# Patient Record
Sex: Female | Born: 1945 | Race: White | Hispanic: No | Marital: Married | Smoking: Never smoker
Health system: Southern US, Community
[De-identification: ages and names within clinical notes are randomized; demographics above are authoritative.]

## PROBLEM LIST (undated history)

## (undated) DIAGNOSIS — F0390 Unspecified dementia without behavioral disturbance: Secondary | ICD-10-CM

## (undated) HISTORY — PX: BREAST BIOPSY: SHX20

## (undated) HISTORY — PX: TUBAL LIGATION: SHX77

## (undated) HISTORY — DX: Unspecified dementia, unspecified severity, without behavioral disturbance, psychotic disturbance, mood disturbance, and anxiety: F03.90

## (undated) HISTORY — PX: BREAST EXCISIONAL BIOPSY: SUR124

## (undated) HISTORY — PX: BACK SURGERY: SHX140

---

## 2015-01-04 LAB — HM MAMMOGRAPHY

## 2015-03-10 ENCOUNTER — Other Ambulatory Visit: Payer: Self-pay | Admitting: Neurological Surgery

## 2015-03-10 DIAGNOSIS — S32000A Wedge compression fracture of unspecified lumbar vertebra, initial encounter for closed fracture: Secondary | ICD-10-CM

## 2015-03-14 ENCOUNTER — Ambulatory Visit (INDEPENDENT_AMBULATORY_CARE_PROVIDER_SITE_OTHER): Payer: Medicare HMO

## 2015-03-14 DIAGNOSIS — K8020 Calculus of gallbladder without cholecystitis without obstruction: Secondary | ICD-10-CM | POA: Diagnosis not present

## 2015-03-14 DIAGNOSIS — M5126 Other intervertebral disc displacement, lumbar region: Secondary | ICD-10-CM | POA: Diagnosis not present

## 2015-03-14 DIAGNOSIS — S32000A Wedge compression fracture of unspecified lumbar vertebra, initial encounter for closed fracture: Secondary | ICD-10-CM

## 2015-03-23 ENCOUNTER — Ambulatory Visit: Payer: Medicare HMO | Admitting: Family Medicine

## 2015-04-01 ENCOUNTER — Ambulatory Visit (INDEPENDENT_AMBULATORY_CARE_PROVIDER_SITE_OTHER): Payer: Medicare HMO | Admitting: Family Medicine

## 2015-04-01 ENCOUNTER — Encounter: Payer: Self-pay | Admitting: Family Medicine

## 2015-04-01 VITALS — BP 131/78 | HR 89 | Ht 66.0 in | Wt 144.0 lb

## 2015-04-01 DIAGNOSIS — S32010S Wedge compression fracture of first lumbar vertebra, sequela: Secondary | ICD-10-CM

## 2015-04-01 DIAGNOSIS — E039 Hypothyroidism, unspecified: Secondary | ICD-10-CM

## 2015-04-01 DIAGNOSIS — S32010A Wedge compression fracture of first lumbar vertebra, initial encounter for closed fracture: Secondary | ICD-10-CM | POA: Insufficient documentation

## 2015-04-01 DIAGNOSIS — E785 Hyperlipidemia, unspecified: Secondary | ICD-10-CM | POA: Diagnosis not present

## 2015-04-01 DIAGNOSIS — D649 Anemia, unspecified: Secondary | ICD-10-CM | POA: Insufficient documentation

## 2015-04-01 DIAGNOSIS — F32A Depression, unspecified: Secondary | ICD-10-CM

## 2015-04-01 DIAGNOSIS — F329 Major depressive disorder, single episode, unspecified: Secondary | ICD-10-CM

## 2015-04-01 LAB — TSH: TSH: 1.186 u[IU]/mL (ref 0.350–4.500)

## 2015-04-01 MED ORDER — CITALOPRAM HYDROBROMIDE 10 MG PO TABS: ORAL_TABLET | ORAL | Status: DC

## 2015-04-01 NOTE — Progress Notes (Signed)
CC: Suzanne Patrick is a 69 y.o. female is here for Establish Care   Subjective: HPI:   pleasant 69 year old here to establish care   history of hypothyroidism currently taking 75 g of levothyroxine on a daily basis. It's uncertain how long she's had this diagnosis but last TSH was in December 2015. She is uncertain if and when her dose has been changed in the past. There's been  No unintentional weight gain or loss. She denies any skin complaints or hair complaints.   History of hyperlipidemia currently taking atorvastatin. She tells me that she has a strong family history of heart attacks and stroke in her family. Cholesterol was last checked about a year ago and LDL cholesterol was at 88. She denies any personal cardiovascular disease. She wants to stop taking atorvastatin.   Reports a history of depression that began 2 and half years ago after the death of her son. She was  Placed on Celexa for crying spells and depression. She believes it worked very well but she wonders if she still needs to be on it now. She denies any  Current mental disturbance such as anxiety or depression or grieving .   a little under a year ago she slipped on her back porch landed on her buttocks and sustained a compression fracture  At L1. Her pain was bad enough that she required hydrocodone  Almost every day of the week up until a few months ago when the pain had slowly fully resolved.  Report some midline back discomfort if she does strenuous activity however it's infrequent and she has difficulty describing it further than above. Denies any radiation of her pain or motor or sensory disturbances  Review of Systems - General ROS: negative for - chills, fever, night sweats, weight gain or weight loss Ophthalmic ROS: negative for - decreased vision Psychological ROS: negative for - anxiety or depression ENT ROS: negative for - hearing change, nasal congestion, tinnitus or allergies Hematological and Lymphatic ROS:  negative for - bleeding problems, bruising or swollen lymph nodes Breast ROS: negative Respiratory ROS: no cough, shortness of breath, or wheezing Cardiovascular ROS: no chest pain or dyspnea on exertion Gastrointestinal ROS: no abdominal pain, change in bowel habits, or black or bloody stools Genito-Urinary ROS: negative for - genital discharge, genital ulcers, incontinence or abnormal bleeding from genitals Musculoskeletal ROS: negative for - joint pain or muscle painother than that described above Neurological ROS: negative for - headaches or memory loss Dermatological ROS: negative for lumps, mole changes, rash and skin lesion changes  History reviewed. No pertinent past medical history.  Past Surgical History  Procedure Laterality Date  . Tubal ligation    . Back surgery     Family History  Problem Relation Age of Onset  . Heart attack Father   . Hyperlipidemia Mother     History   Social History  . Marital Status: Unknown    Spouse Name: N/A  . Number of Children: N/A  . Years of Education: N/A   Occupational History  . Not on file.   Social History Main Topics  . Smoking status: Never Smoker   . Smokeless tobacco: Not on file  . Alcohol Use: 1.8 oz/week    3 Standard drinks or equivalent per week  . Drug Use: No  . Sexual Activity:    Partners: Male   Other Topics Concern  . Not on file   Social History Narrative  . No narrative on file  Objective: BP 131/78 mmHg  Pulse 89  Ht 5\' 6"  (1.676 m)  Wt 144 lb (65.318 kg)  BMI 23.25 kg/m2  General: Alert and Oriented, No Acute Distress HEENT: Pupils equal, round, reactive to light. Conjunctivae clear. Moist mucous membranes Lungs: Clear to auscultation bilaterally, no wheezing/ronchi/rales.  Comfortable work of breathing. Good air movement. Cardiac: Regular rate and rhythm. Normal S1/S2.  No murmurs, rubs, nor gallops.   Extremities: No peripheral edema.  Strong peripheral pulses.  Mental Status: No  depression, anxiety, nor agitation. Skin: Warm and dry.  Assessment & Plan: Jozalynn was seen today for establish care.  Diagnoses and all orders for this visit:  Hypothyroidism, unspecified hypothyroidism type Orders: -     TSH  Hyperlipidemia  Depression Orders: -     citalopram (CELEXA) 10 MG tablet; One by mouth daily for one week then half a tablet by mouth daily for one week then stop.  Compression fracture of L1 lumbar vertebra, sequela   Hypothyroidism: Clinically controlled due for TSH continue 75 g levothyroxine pending results Hyperlipidemia: Urged her to continue on atorvastatin given that it appears to be controlling her cholesterol well based on most recent lipid panel Depression: Discussed an attempt at stopping citalopram with a taper beginning at 10 mg daily for a week and then 5 mg daily for one week then stop.I have invited her to call me if she feels like she would like to go back on citalopram at either 10 mg or 20 mg daily. Compression fractures currently asymptomatic no further intervention on this issue.   Return in about 3 months (around 07/02/2015).

## 2015-05-02 ENCOUNTER — Telehealth: Payer: Self-pay

## 2015-05-02 NOTE — Telephone Encounter (Signed)
Pt called stating that she feel like she is having some arthritis pain in her leg and knees and want to know if there is any OTC meds you recommend for this. The pt also called in regards to her daughter Quentin Mulling DOB 10/15/1983. Theadora Rama is overweight and recently lost her insurance. She is interested in scheduling a office visit to discuss weight loss meds but want to know how much it would be for the visit and the rx. Please advise.

## 2015-05-02 NOTE — Telephone Encounter (Signed)
My favorite OTC anti-inflammatory is aleve 1-2 tabs twice a day as needed for pain.  As for Suzanne Patrick I'd recommend she do some online research on phentermine since this is the first line medication for weight loss in our clinic.  If she wants to try it, all we would need is a nurse visit to get a baseline weight and I wouldn't charge her for that visit. This medication is usually about $30.  If she wants to discus other options then an office visit would be required, I'd estimate an office visit would cost about $150 before a 50% discount [facility] gives self pay patients.

## 2015-05-03 NOTE — Telephone Encounter (Signed)
Pt notified of recommendations and verbalized understanding  

## 2015-06-08 ENCOUNTER — Ambulatory Visit (INDEPENDENT_AMBULATORY_CARE_PROVIDER_SITE_OTHER): Payer: Medicare HMO | Admitting: Family Medicine

## 2015-06-08 ENCOUNTER — Encounter: Payer: Self-pay | Admitting: Family Medicine

## 2015-06-08 VITALS — BP 124/80 | HR 86 | Wt 143.0 lb

## 2015-06-08 DIAGNOSIS — L821 Other seborrheic keratosis: Secondary | ICD-10-CM

## 2015-06-08 DIAGNOSIS — A499 Bacterial infection, unspecified: Secondary | ICD-10-CM | POA: Diagnosis not present

## 2015-06-08 DIAGNOSIS — J329 Chronic sinusitis, unspecified: Secondary | ICD-10-CM | POA: Diagnosis not present

## 2015-06-08 DIAGNOSIS — B9689 Other specified bacterial agents as the cause of diseases classified elsewhere: Secondary | ICD-10-CM

## 2015-06-08 MED ORDER — CEFDINIR 300 MG PO CAPS: 300.0000 mg | ORAL_CAPSULE | Freq: Two times a day (BID) | ORAL | Status: AC

## 2015-06-08 NOTE — Progress Notes (Signed)
CC: Suzanne Patrick is a 69 y.o. female is here for Sinusitis   Subjective: HPI:  Complains of facial pressure localizing the forehead with nonproductive cough and scratchy throat. Symptoms have been present for 2 weeks now. Seems to be worse in the evenings. Interventions have included ibuprofen without much benefit. No benefit from over-the-counter anti-histamines. Symptoms are moderate in severity. Has had subjective fever and chills. Denies difficulty swallowing, wheezing, shortness of breath, productive cough nor confusion. No vision loss.  She points out a growth on her right shin that's been present for matter of years. It is not painful but occasionally will itch. Does not seem to be getting larger or smaller. She wants to know what is   Review Of Systems Outlined In HPI  No past medical history on file.  Past Surgical History  Procedure Laterality Date  . Tubal ligation    . Back surgery     Family History  Problem Relation Age of Onset  . Heart attack Father   . Hyperlipidemia Mother     History   Social History  . Marital Status: Unknown    Spouse Name: N/A  . Number of Children: N/A  . Years of Education: N/A   Occupational History  . Not on file.   Social History Main Topics  . Smoking status: Never Smoker   . Smokeless tobacco: Not on file  . Alcohol Use: 1.8 oz/week    3 Standard drinks or equivalent per week  . Drug Use: No  . Sexual Activity:    Partners: Male   Other Topics Concern  . Not on file   Social History Narrative     Objective: BP 124/80 mmHg  Pulse 86  Wt 143 lb (64.864 kg)  General: Alert and Oriented, No Acute Distress HEENT: Pupils equal, round, reactive to light. Conjunctivae clear.  External ears unremarkable, canals clear with intact TMs with appropriate landmarks.  Middle ear appears open without effusion. Pink inferior turbinates.  Moist mucous membranes, pharynx without inflammation nor lesions other than postnasal drip  and cobblestoning.  Neck supple without palpable lymphadenopathy nor abnormal masses. Lungs: Clear to auscultation bilaterally, no wheezing/ronchi/rales.  Comfortable work of breathing. Good air movement. Cardiac: Regular rate and rhythm. Normal S1/S2.  No murmurs, rubs, nor gallops.   Mental Status: No depression, anxiety, nor agitation. Skin: Warm and dry. 4 mm diameter slightly raised noninflamed seborrheic keratosis on the right shin  Assessment & Plan: Suzanne Patrick was seen today for sinusitis.  Diagnoses and all orders for this visit:  Bacterial sinusitis Orders: -     cefdinir (OMNICEF) 300 MG capsule; Take 1 capsule (300 mg total) by mouth 2 (two) times daily.  Seborrheic keratoses   bacterial sinusitis: Consider nasal saline washes and start [place]. Reassurance provided that her seborrheic keratosis is benign and unless it's causing her pain there is no real strong indication to have it removed. She was comforted and does not want anything to be done about it  25 minutes spent face-to-face during visit today of which at least 50% was counseling or coordinating care regarding: 1. Bacterial sinusitis   2. Seborrheic keratoses     Return if symptoms worsen or fail to improve.

## 2015-06-20 ENCOUNTER — Encounter: Payer: Self-pay | Admitting: Cardiology

## 2015-07-11 ENCOUNTER — Telehealth: Payer: Self-pay | Admitting: *Deleted

## 2015-07-11 DIAGNOSIS — N644 Mastodynia: Secondary | ICD-10-CM

## 2015-07-11 NOTE — Telephone Encounter (Signed)
Pt called and states she is having a lot of itching in her breast and thinks its due to the "chip" that she has in her breast. Pt would like a referral place to have this removed.

## 2015-07-12 MED ORDER — TRIAMCINOLONE ACETONIDE 0.1 % EX CREA: TOPICAL_CREAM | CUTANEOUS | Status: DC

## 2015-07-12 NOTE — Telephone Encounter (Signed)
Pt.notified

## 2015-07-12 NOTE — Telephone Encounter (Signed)
Suzanne Patrick, Referral placed.  I can't confidently conclude that her itching is coming from the chip therefore I'd recommend starting a topical trimacinalone regimen for the next two weeks to see if this resolves the itching.  Rx sent to [facility].

## 2015-07-14 ENCOUNTER — Telehealth: Payer: Self-pay | Admitting: *Deleted

## 2015-07-14 DIAGNOSIS — N644 Mastodynia: Secondary | ICD-10-CM

## 2015-07-14 NOTE — Telephone Encounter (Signed)
Pt wants referral to go to [facility] with Dr. Dia Sitter

## 2015-08-01 ENCOUNTER — Encounter: Payer: Self-pay | Admitting: Family Medicine

## 2015-08-01 DIAGNOSIS — D249 Benign neoplasm of unspecified breast: Secondary | ICD-10-CM | POA: Insufficient documentation

## 2015-09-14 ENCOUNTER — Encounter: Payer: Self-pay | Admitting: Family Medicine

## 2015-09-14 ENCOUNTER — Ambulatory Visit (INDEPENDENT_AMBULATORY_CARE_PROVIDER_SITE_OTHER): Payer: Medicare HMO | Admitting: Family Medicine

## 2015-09-14 VITALS — BP 118/75 | HR 95 | Wt 141.0 lb

## 2015-09-14 DIAGNOSIS — R1310 Dysphagia, unspecified: Secondary | ICD-10-CM | POA: Diagnosis not present

## 2015-09-14 DIAGNOSIS — R413 Other amnesia: Secondary | ICD-10-CM | POA: Diagnosis not present

## 2015-09-14 MED ORDER — PREDNISONE 20 MG PO TABS: ORAL_TABLET | ORAL | Status: AC

## 2015-09-14 NOTE — Progress Notes (Signed)
CC: Suzanne Patrick is a 69 y.o. female is here for Nasal Congestion   Subjective: HPI:  Complains of a knot sensation in the back of her right throat. She tells me it feels like something is stuck back there. This is been going on for 2 weeks. Is mild in severity but grabbing her attention. No interventions as of yet. She also believes it's making her voice little hoarse. She denies any choking or pulmonary complaints. She denies fevers, chills, aspiration, neck pain, regurgitation, shortness of breath but does have a new cough that began 2 weeks ago as well.  She tells me that she went to the wrong clinic this afternoon forgetting that my office was here at this clinic. She tells me that she's noticed that she is more forgetful in regards to short-term memory over the past few months. Her husband has noticed that as well. She wants to know if there is some sort of tested check on her memory. She denies any long-term memory loss.   Review Of Systems Outlined In HPI  No past medical history on file.  Past Surgical History  Procedure Laterality Date  . Tubal ligation    . Back surgery     Family History  Problem Relation Age of Onset  . Heart attack Father   . Hyperlipidemia Mother     Social History   Social History  . Marital Status: Unknown    Spouse Name: N/A  . Number of Children: N/A  . Years of Education: N/A   Occupational History  . Not on file.   Social History Main Topics  . Smoking status: Never Smoker   . Smokeless tobacco: Not on file  . Alcohol Use: 1.8 oz/week    3 Standard drinks or equivalent per week  . Drug Use: No  . Sexual Activity:    Partners: Male   Other Topics Concern  . Not on file   Social History Narrative     Objective: BP 118/75 mmHg  Pulse 95  Wt 141 lb (63.957 kg)  General: Alert and Oriented, No Acute Distress HEENT: Pupils equal, round, reactive to light. Conjunctivae clear.  External ears unremarkable, canals clear with  intact TMs with appropriate landmarks.  Middle ear appears open without effusion. Pink inferior turbinates.  Moist mucous membranes, pharynx without inflammation nor lesions.  Neck supple without palpable lymphadenopathy nor abnormal masses. Lungs: Clear comfortable work of breathing Extremities: No peripheral edema.  Strong peripheral pulses.  Mental Status: No depression, anxiety, nor agitation. Skin: Warm and dry.  Assessment & Plan: Suzanne Patrick was seen today for nasal congestion.  Diagnoses and all orders for this visit:  Dysphagia -     predniSONE (DELTASONE) 20 MG tablet; Two tabs at once daily for five days.  Short-term memory loss   Dysphagia: Start moderate dose of prednisone for suspicion of laryngitis.Signs and symptoms requring emergent/urgent reevaluation were discussed with the patient.  short-term memory loss:  Discussed that this definitely warrants further investigation and that she should follow-up at her convenience for visit dedicated to this so we can do a mental status exam and also some blood work.  25 minutes spent face-to-face during visit today of which at least 50% was counseling or coordinating care regarding: 1. Dysphagia   2. Short-term memory loss      Return if symptoms worsen or fail to improve.

## 2015-09-19 ENCOUNTER — Telehealth: Payer: Self-pay

## 2015-09-19 MED ORDER — MELOXICAM 15 MG PO TABS
15.0000 mg | ORAL_TABLET | Freq: Every day | ORAL | Status: DC | PRN
Start: 2015-09-19 — End: 2016-12-18

## 2015-09-19 NOTE — Telephone Encounter (Signed)
An additional anti-inflammatory has been sent to her [facility]

## 2015-09-19 NOTE — Telephone Encounter (Signed)
Pt advised.

## 2015-09-19 NOTE — Telephone Encounter (Signed)
Pt reports that her throat is a little better but is still sore and wants to know can something be sent to the pharmacy?

## 2015-10-03 ENCOUNTER — Telehealth: Payer: Self-pay

## 2015-10-03 NOTE — Telephone Encounter (Signed)
Pt called stating that she believes prednisone gave her bad breath.  She has tried everything from different mouth washes to flossing.  She wants to know do you have any suggestions?

## 2015-10-03 NOTE — Telephone Encounter (Signed)
OTC probiotic daily other than that I'm not sure of any other ideas.

## 2015-10-03 NOTE — Telephone Encounter (Signed)
Pt.notified

## 2015-10-12 ENCOUNTER — Encounter: Payer: Self-pay | Admitting: Family Medicine

## 2015-10-12 ENCOUNTER — Ambulatory Visit (INDEPENDENT_AMBULATORY_CARE_PROVIDER_SITE_OTHER): Payer: Medicare HMO | Admitting: Family Medicine

## 2015-10-12 VITALS — BP 127/88 | HR 78 | Wt 137.0 lb

## 2015-10-12 DIAGNOSIS — R1310 Dysphagia, unspecified: Secondary | ICD-10-CM | POA: Diagnosis not present

## 2015-10-12 DIAGNOSIS — E039 Hypothyroidism, unspecified: Secondary | ICD-10-CM

## 2015-10-12 DIAGNOSIS — Z23 Encounter for immunization: Secondary | ICD-10-CM | POA: Diagnosis not present

## 2015-10-12 DIAGNOSIS — S39011A Strain of muscle, fascia and tendon of abdomen, initial encounter: Secondary | ICD-10-CM | POA: Diagnosis not present

## 2015-10-12 LAB — TSH: TSH: 0.412 u[IU]/mL (ref 0.350–4.500)

## 2015-10-12 NOTE — Progress Notes (Signed)
CC: Suzanne Patrick is a 69 y.o. female is here for lump in throat and Hip Pain   Subjective: HPI:   complains of a knot sensation in her throat present all hours of the day. Symptoms have not improved with use of prednisone nor nonsteroidal anti-inflammatories.  Additionally no benefit from salt water gargles or mouthwash. No other interventions as of yet. Symptoms are mild in severity and have not been getting better or worse since onset about a month ago.  Denies any difficulty swallowing such as choking or regurgitation. She denies any reflux symptoms.  There has been no change in her voice or unintentional weight loss.   She's been having some difficulty with feeling fatigued most hours of the day. She denies nonrestorative sleep or witnessed apneic episodes. Denies shortness of breath or weakness.   She's also having some right and left lower quadrant tenderness That's absent first in the morning and seems to worsen as the day goes on. It's worse with decorating her house. Nothing else makes it better or worse. She is  Uncertain how long it's been going on.  She denies any  Fevers, chills, nor nausea.  Denies any constipation or diarrhea.   Review Of Systems Outlined In HPI  No past medical history on file.  Past Surgical History  Procedure Laterality Date  . Tubal ligation    . Back surgery     Family History  Problem Relation Age of Onset  . Heart attack Father   . Hyperlipidemia Mother     Social History   Social History  . Marital Status: Unknown    Spouse Name: N/A  . Number of Children: N/A  . Years of Education: N/A   Occupational History  . Not on file.   Social History Main Topics  . Smoking status: Never Smoker   . Smokeless tobacco: Not on file  . Alcohol Use: 1.8 oz/week    3 Standard drinks or equivalent per week  . Drug Use: No  . Sexual Activity:    Partners: Male   Other Topics Concern  . Not on file   Social History Narrative      Objective: BP 127/88 mmHg  Pulse 78  Wt 137 lb (62.143 kg)  General: Alert and Oriented, No Acute Distress HEENT: Pupils equal, round, reactive to light. Conjunctivae clear.   Moist mucous membranes, pharynx without inflammation nor lesions.  Neck supple without palpable lymphadenopathy nor abnormal masses. Lungs: Clear to auscultation bilaterally, no wheezing/ronchi/rales.  Comfortable work of breathing. Good air movement. Cardiac: Regular rate and rhythm. Normal S1/S2.  No murmurs, rubs, nor gallops.   Abdomen: Normal bowel sounds, soft and non tender without palpable masses.  No rebound tenderness Extremities: No peripheral edema.  Strong peripheral pulses.  Mental Status: No depression, anxiety, nor agitation. Skin: Warm and dry.  Assessment & Plan: Suzanne Patrick was seen today for lump in throat and hip pain.  Diagnoses and all orders for this visit:  Need for prophylactic vaccination against Streptococcus pneumoniae (pneumococcus) -     Pneumococcal conjugate vaccine 13-valent  Encounter for immunization  Hypothyroidism, unspecified hypothyroidism type -     TSH  Dysphagia -     US Soft Tissue Head/Neck; Future  Abdominal wall strain, initial encounter  Other orders -     Flu Vaccine QUAD 36+ mos IM    ultrasound of the neck to rule out thyroid nodule or soft tissue mass causing her sensation of a knot in the throat.  rule out uncontrolled hypothyroidism with TSH  To see if her fatigue is due to underdosed  Levothyroxine  Start abdominal wall rehabilitation exercises that were given to her today, I've encouraged to do this on a daily basis for the next 2-3 weeks to help with abdominal pain.  Signs and symptoms requring emergent/urgent reevaluation were discussed with the patient.  Return if symptoms worsen or fail to improve.

## 2015-10-13 ENCOUNTER — Telehealth: Payer: Self-pay | Admitting: Family Medicine

## 2015-10-13 ENCOUNTER — Ambulatory Visit (INDEPENDENT_AMBULATORY_CARE_PROVIDER_SITE_OTHER): Payer: Medicare HMO

## 2015-10-13 DIAGNOSIS — R1310 Dysphagia, unspecified: Secondary | ICD-10-CM

## 2015-10-13 NOTE — Telephone Encounter (Signed)
Will you please let patient know that her neck ultrasound and thyroid blood test were normal.  As discussed during our office visit I'll place a referral to an ENT specialist for further work up.

## 2015-10-13 NOTE — Telephone Encounter (Signed)
Pt.notified

## 2015-10-13 NOTE — Telephone Encounter (Signed)
Phone not accepting vm 

## 2015-11-02 ENCOUNTER — Encounter: Payer: Self-pay | Admitting: Family Medicine

## 2015-11-02 DIAGNOSIS — J3501 Chronic tonsillitis: Secondary | ICD-10-CM | POA: Insufficient documentation

## 2015-11-28 ENCOUNTER — Telehealth: Payer: Self-pay

## 2015-11-28 NOTE — Telephone Encounter (Signed)
Pt.notified

## 2015-11-28 NOTE — Telephone Encounter (Signed)
I can do the one she's referring to, I will also want to do some blood work when she finds time to schedule this appointment.

## 2015-11-28 NOTE — Telephone Encounter (Signed)
Pt is asking for an appointment for a memory test.  Are those done in this office? (Call pt at this number [phone] *FYI for Vernessa Likes*)

## 2015-12-06 ENCOUNTER — Ambulatory Visit (INDEPENDENT_AMBULATORY_CARE_PROVIDER_SITE_OTHER): Payer: Medicare HMO | Admitting: Family Medicine

## 2015-12-06 ENCOUNTER — Encounter: Payer: Self-pay | Admitting: Family Medicine

## 2015-12-06 VITALS — BP 129/70 | HR 81 | Wt 137.0 lb

## 2015-12-06 DIAGNOSIS — G309 Alzheimer's disease, unspecified: Secondary | ICD-10-CM | POA: Insufficient documentation

## 2015-12-06 DIAGNOSIS — F0280 Dementia in other diseases classified elsewhere without behavioral disturbance: Secondary | ICD-10-CM | POA: Insufficient documentation

## 2015-12-06 DIAGNOSIS — F0390 Unspecified dementia without behavioral disturbance: Secondary | ICD-10-CM | POA: Insufficient documentation

## 2015-12-06 DIAGNOSIS — R413 Other amnesia: Secondary | ICD-10-CM

## 2015-12-06 DIAGNOSIS — K130 Diseases of lips: Secondary | ICD-10-CM

## 2015-12-06 LAB — COMPLETE METABOLIC PANEL WITH GFR
ALT: 17 U/L (ref 6–29)
AST: 21 U/L (ref 10–35)
Albumin: 4.3 g/dL (ref 3.6–5.1)
Alkaline Phosphatase: 91 U/L (ref 33–130)
BILIRUBIN TOTAL: 0.6 mg/dL (ref 0.2–1.2)
BUN: 22 mg/dL (ref 7–25)
CHLORIDE: 105 mmol/L (ref 98–110)
CO2: 25 mmol/L (ref 20–31)
CREATININE: 0.9 mg/dL (ref 0.60–0.93)
Calcium: 9.2 mg/dL (ref 8.6–10.4)
GFR, Est African American: 75 mL/min (ref >=60)
GFR, Est Non African American: 65 mL/min (ref >=60)
Glucose, Bld: 97 mg/dL (ref 65–99)
Potassium: 3.9 mmol/L (ref 3.5–5.3)
Sodium: 139 mmol/L (ref 135–146)
TOTAL PROTEIN: 6.4 g/dL (ref 6.1–8.1)

## 2015-12-06 LAB — VITAMIN B12: VITAMIN B 12: 409 pg/mL (ref 211–911)

## 2015-12-06 MED ORDER — TRIAMCINOLONE ACETONIDE 0.5 % EX OINT: 1.0000 "application " | TOPICAL_OINTMENT | Freq: Two times a day (BID) | CUTANEOUS | Status: DC

## 2015-12-06 NOTE — Progress Notes (Signed)
CC: Suzanne Patrick is a 70 y.o. female is here for mini mental   Subjective: HPI:  Complains of cracking and irritation in the left corner of the lip. It's been present for the last 2 or 3 days. No intervention as of yet. Slightly tender. It's mild in severity. Nothing seems to make it better or worse.  Her main concern is memory loss that's been noticed by her family and herself over the past few months. It's present on a daily basis and mild in severity. The family is concerned that she is suffering from dementia. She denies any long-term memory loss but focuses on her issues with short-term memory loss. For example she'll forget tasks that she has planned out for later in the day. She'll start a task only to forget halfway through why she is doing however within a few minutes sure member why she was doing the previous task. She denies any issues with getting lost. No new motor or sensory disturbances.   Review Of Systems Outlined In HPI  No past medical history on file.  Past Surgical History  Procedure Laterality Date  . Tubal ligation    . Back surgery     Family History  Problem Relation Age of Onset  . Heart attack Father   . Hyperlipidemia Mother     Social History   Social History  . Marital Status: Unknown    Spouse Name: N/A  . Number of Children: N/A  . Years of Education: N/A   Occupational History  . Not on file.   Social History Main Topics  . Smoking status: Never Smoker   . Smokeless tobacco: Not on file  . Alcohol Use: 1.8 oz/week    3 Standard drinks or equivalent per week  . Drug Use: No  . Sexual Activity:    Partners: Male   Other Topics Concern  . Not on file   Social History Narrative     Objective: BP 129/70 mmHg  Pulse 81  Wt 137 lb (62.143 kg)  Vital signs reviewed. General: Alert and Oriented, No Acute Distress HEENT: Pupils equal, round, reactive to light. Conjunctivae clear.  External ears unremarkable.  Moist mucous membranes.   Lungs: Clear and comfortable work of breathing, speaking in full sentences without accessory muscle use. Cardiac: Regular rate and rhythm.  Neuro: CN II-XII grossly intact, gait normal. Extremities: No peripheral edema.  Strong peripheral pulses.  Mental Status: No depression, anxiety, nor agitation. Logical though process. Skin: Warm and dry. Mild erythema and cracking in the left corner of the lip. Assessment & Plan: Fiza was seen today for mini mental.  Diagnoses and all orders for this visit:  Memory loss -     Vitamin B12 -     COMPLETE METABOLIC PANEL WITH GFR  Angular cheilitis  Other orders -     triamcinolone ointment (KENALOG) 0.5 %; Apply 1 application topically 2 (two) times daily. Apply only for two weeks.   memory loss: She did great on the MMSE, she has a high school level of education and got 29 out of 30. She had difficulty spelling world backwards. Will rule out metabolic abnormality or [id number] deficiency. If normal she would still like to try Aricept to help preserve mild memory loss. Discussed that her MMSE was reassuring and makes dementia very unlikely at this time.  25 minutes spent face-to-face during visit today of which at least 50% was counseling or coordinating care regarding: 1. Memory loss   2. Angular  cheilitis       No Follow-up on file.

## 2015-12-07 ENCOUNTER — Telehealth: Payer: Self-pay | Admitting: Family Medicine

## 2015-12-07 MED ORDER — LIDOCAINE VISCOUS 2 % MT SOLN: OROMUCOSAL | Status: DC

## 2015-12-07 MED ORDER — DONEPEZIL HCL 5 MG PO TABS: 5.0000 mg | ORAL_TABLET | Freq: Every day | ORAL | Status: DC

## 2015-12-07 NOTE — Telephone Encounter (Signed)
Left message to call for recommendations

## 2015-12-07 NOTE — Telephone Encounter (Signed)
Will you please let patient know that her blood work was normal therefore I'll send in a Rx of Aricept to her [facility], this is to help with memory.

## 2015-12-07 NOTE — Telephone Encounter (Signed)
I'll send in a Rx for viscous lidocaine.  Please remind patient that Dr. Amedeo Kinsman note from her encounter in December recommended that she follow up with him if symptoms worsen.

## 2015-12-07 NOTE — Telephone Encounter (Signed)
Suzanne Patrick is asking could you possibly Rx her something for her throat?

## 2015-12-08 ENCOUNTER — Other Ambulatory Visit: Payer: Self-pay

## 2015-12-08 MED ORDER — DONEPEZIL HCL 5 MG PO TABS: 5.0000 mg | ORAL_TABLET | Freq: Every day | ORAL | Status: DC

## 2015-12-08 MED ORDER — LIDOCAINE VISCOUS 2 % MT SOLN: OROMUCOSAL | Status: DC

## 2015-12-08 NOTE — Telephone Encounter (Signed)
Pt advised to follow up with Dr. Vevelyn Royals

## 2015-12-08 NOTE — Telephone Encounter (Signed)
Pt.notified

## 2015-12-28 ENCOUNTER — Telehealth: Payer: Self-pay

## 2015-12-28 NOTE — Telephone Encounter (Signed)
It would be wise to re-vist with Dr. Vevelyn Royals at [facility].  If she needs help scheduling an appointment can you please see if the referral clerk of the day can arrange this?

## 2015-12-28 NOTE — Telephone Encounter (Signed)
Pt stated that during her last visit you suggested that she contact us if her throat isn't any better.

## 2015-12-29 NOTE — Telephone Encounter (Signed)
Pt advised.

## 2016-02-01 ENCOUNTER — Other Ambulatory Visit: Payer: Self-pay

## 2016-02-01 MED ORDER — DONEPEZIL HCL 5 MG PO TABS: 5.0000 mg | ORAL_TABLET | Freq: Every day | ORAL | Status: DC

## 2016-02-20 ENCOUNTER — Telehealth: Payer: Self-pay

## 2016-02-20 DIAGNOSIS — R1310 Dysphagia, unspecified: Secondary | ICD-10-CM

## 2016-02-20 NOTE — Telephone Encounter (Signed)
Pt is asking to be referred to a different ENT office for a second opinion.

## 2016-02-20 NOTE — Telephone Encounter (Signed)
Referral placed.

## 2016-02-28 ENCOUNTER — Ambulatory Visit (INDEPENDENT_AMBULATORY_CARE_PROVIDER_SITE_OTHER): Payer: Medicare HMO | Admitting: Family Medicine

## 2016-02-28 ENCOUNTER — Encounter: Payer: Self-pay | Admitting: Family Medicine

## 2016-02-28 ENCOUNTER — Other Ambulatory Visit (HOSPITAL_COMMUNITY)
Admission: RE | Admit: 2016-02-28 | Discharge: 2016-02-28 | Disposition: A | Discharge status: Home / Self Care | Payer: Medicare HMO | Source: Ambulatory Visit | Attending: Family Medicine | Admitting: Family Medicine

## 2016-02-28 VITALS — BP 132/91 | HR 92 | Wt 131.0 lb

## 2016-02-28 DIAGNOSIS — Z1151 Encounter for screening for human papillomavirus (HPV): Secondary | ICD-10-CM | POA: Diagnosis present

## 2016-02-28 DIAGNOSIS — Z01419 Encounter for gynecological examination (general) (routine) without abnormal findings: Secondary | ICD-10-CM | POA: Diagnosis present

## 2016-02-28 DIAGNOSIS — L739 Follicular disorder, unspecified: Secondary | ICD-10-CM | POA: Diagnosis not present

## 2016-02-28 DIAGNOSIS — Z124 Encounter for screening for malignant neoplasm of cervix: Secondary | ICD-10-CM

## 2016-02-28 MED ORDER — MUPIROCIN 2 % EX OINT: TOPICAL_OINTMENT | CUTANEOUS | Status: DC

## 2016-02-28 NOTE — Progress Notes (Signed)
CC: Suzanne Patrick is a 70 y.o. female is here for Gynecologic Exam   Subjective: HPI:   requesting a routine Pap smear, she is not certain when her last menstrual cycle was but was more than a few years ago since she went into menopause. She denies any genitourinary complaints other than some bumps on her left labia that are slightly tender to the touch. She is not sure how long they've been there but they seem to fluctuate over the past couple months. No interventions as of yet. No fevers, chills, nor skin changes elsewhere.   Review Of Systems Outlined In HPI  No past medical history on file.  Past Surgical History  Procedure Laterality Date  . Tubal ligation    . Back surgery     Family History  Problem Relation Age of Onset  . Heart attack Father   . Hyperlipidemia Mother     Social History   Social History  . Marital Status: Unknown    Spouse Name: N/A  . Number of Children: N/A  . Years of Education: N/A   Occupational History  . Not on file.   Social History Main Topics  . Smoking status: Never Smoker   . Smokeless tobacco: Not on file  . Alcohol Use: 1.8 oz/week    3 Standard drinks or equivalent per week  . Drug Use: No  . Sexual Activity:    Partners: Male   Other Topics Concern  . Not on file   Social History Narrative     Objective: BP 132/91 mmHg  Pulse 92  Wt 131 lb (59.421 kg)   GU: Labia majora & minora without lesions other than mild folliculitis on the left labia majora..  Distal urethra unremarkable.  Vaginal wall integrity preserved without mucosal lesions.  Cervix non-tender and without gross lesions nor discharge.   Vital signs reviewed. General: Alert and Oriented, No Acute Distress HEENT: Pupils equal, round, reactive to light. Conjunctivae clear.  External ears unremarkable.  Moist mucous membranes. Lungs: Clear and comfortable work of breathing, speaking in full sentences without accessory muscle use. Cardiac: Regular rate and  rhythm.  Neuro: CN II-XII grossly intact, gait normal. Extremities: No peripheral edema.  Strong peripheral pulses.  Mental Status: No depression, anxiety, nor agitation. Logical though process. Skin: Warm and dry.   Assessment & Plan: [name] was seen today for gynecologic exam.  Diagnoses and all orders for this visit:  Folliculitis -     mupirocin ointment (BACTROBAN) 2 %; Apply to bumps on groin twice a day, call if no better in two weeks.  Screening for cervical cancer -     Cytology - PAP   Pap sent to cytology Start Bactroban for folliculitis and call if no better after 2 weeks, next step will be clindamycin.  Return if symptoms worsen or fail to improve.

## 2016-02-29 LAB — CYTOLOGY - PAP

## 2016-03-07 ENCOUNTER — Telehealth: Payer: Self-pay

## 2016-03-07 MED ORDER — CITALOPRAM HYDROBROMIDE 20 MG PO TABS: 20.0000 mg | ORAL_TABLET | Freq: Every day | ORAL | Status: DC

## 2016-03-07 MED ORDER — OMEPRAZOLE 20 MG PO CPDR: 20.0000 mg | DELAYED_RELEASE_CAPSULE | Freq: Every day | ORAL | Status: DC

## 2016-03-07 NOTE — Telephone Encounter (Signed)
I'm ok with this, Rx sent to her [facility]

## 2016-03-07 NOTE — Telephone Encounter (Signed)
Pt is asking for a refill of citalopram 20 mg.  This medication was D/C last May.  Is this refill appropriate?

## 2016-03-28 ENCOUNTER — Telehealth: Payer: Self-pay | Admitting: Family Medicine

## 2016-03-28 MED ORDER — CITALOPRAM HYDROBROMIDE 20 MG PO TABS: 20.0000 mg | ORAL_TABLET | Freq: Every day | ORAL | Status: DC

## 2016-03-28 MED ORDER — OMEPRAZOLE 20 MG PO CPDR: 20.0000 mg | DELAYED_RELEASE_CAPSULE | Freq: Every day | ORAL | Status: DC

## 2016-03-28 MED ORDER — LEVOTHYROXINE SODIUM 75 MCG PO TABS: 75.0000 ug | ORAL_TABLET | Freq: Every day | ORAL | Status: DC

## 2016-03-28 MED ORDER — DONEPEZIL HCL 5 MG PO TABS: 5.0000 mg | ORAL_TABLET | Freq: Every day | ORAL | Status: DC

## 2016-03-28 MED ORDER — ATORVASTATIN CALCIUM 40 MG PO TABS: 40.0000 mg | ORAL_TABLET | Freq: Every day | ORAL | Status: DC

## 2016-03-28 NOTE — Telephone Encounter (Signed)
Refill req 

## 2016-04-03 ENCOUNTER — Telehealth: Payer: Self-pay

## 2016-04-03 NOTE — Telephone Encounter (Signed)
Patient called stated that she is currently out of 1 of her medications but she will call back with the information so that it can be called to a local pharmacy. Marquan Vokes,CMA

## 2016-04-13 ENCOUNTER — Telehealth: Payer: Self-pay

## 2016-04-13 NOTE — Telephone Encounter (Signed)
Pt notified and transferred to the front desk  

## 2016-04-13 NOTE — Telephone Encounter (Signed)
Correct, but office visit first to go along with this.

## 2016-04-13 NOTE — Telephone Encounter (Signed)
Pt stated that her husband told her that during his visit you suggested that she have some lab work done. Please advise.

## 2016-04-16 ENCOUNTER — Other Ambulatory Visit: Payer: Self-pay | Admitting: Family Medicine

## 2016-04-16 ENCOUNTER — Ambulatory Visit (INDEPENDENT_AMBULATORY_CARE_PROVIDER_SITE_OTHER): Payer: Medicare HMO | Admitting: Family Medicine

## 2016-04-16 ENCOUNTER — Encounter: Payer: Self-pay | Admitting: Family Medicine

## 2016-04-16 VITALS — BP 119/78 | HR 70 | Wt 134.0 lb

## 2016-04-16 DIAGNOSIS — E039 Hypothyroidism, unspecified: Secondary | ICD-10-CM

## 2016-04-16 DIAGNOSIS — R232 Flushing: Secondary | ICD-10-CM

## 2016-04-16 DIAGNOSIS — N951 Menopausal and female climacteric states: Secondary | ICD-10-CM

## 2016-04-16 DIAGNOSIS — R1031 Right lower quadrant pain: Secondary | ICD-10-CM

## 2016-04-16 DIAGNOSIS — N9489 Other specified conditions associated with female genital organs and menstrual cycle: Secondary | ICD-10-CM

## 2016-04-16 DIAGNOSIS — N949 Unspecified condition associated with female genital organs and menstrual cycle: Secondary | ICD-10-CM

## 2016-04-16 DIAGNOSIS — S32010S Wedge compression fracture of first lumbar vertebra, sequela: Secondary | ICD-10-CM | POA: Diagnosis not present

## 2016-04-16 DIAGNOSIS — R5383 Other fatigue: Secondary | ICD-10-CM | POA: Diagnosis not present

## 2016-04-16 LAB — CBC
HEMATOCRIT: 37.3 % (ref 35.0–45.0)
Hemoglobin: 11.9 g/dL (ref 11.7–15.5)
MCH: 24.5 pg — ABNORMAL LOW (ref 27.0–33.0)
MCHC: 31.9 g/dL — AB (ref 32.0–36.0)
MCV: 76.9 fL — AB (ref 80.0–100.0)
MPV: 9.9 fL (ref 7.5–12.5)
PLATELETS: 251 10*3/uL (ref 140–400)
RBC: 4.85 MIL/uL (ref 3.80–5.10)
RDW: 18.3 % — AB (ref 11.0–15.0)
WBC: 6.7 10*3/uL (ref 3.8–10.8)

## 2016-04-16 LAB — TSH: TSH: 2.31 mIU/L

## 2016-04-16 NOTE — Progress Notes (Signed)
CC: Suzanne Patrick is a 70 y.o. female is here for Fatigue and Labs Only   Subjective: HPI:  Fatigue on a daily basis noticed mostly in the morning. She denies any disturbance with sleep and for the first minutes feels well rested after making breakfast and talking to her husband though she is usually back to bed after being awake for only an hour. It's uncertain how long she sleeps then. She tells me she feels full of energy throughout the rest of the day and enjoys gardening outside. Denies any shortness of breath or weakness  She also wants to know if she can go back on estrogen, she is having hot flashes that are occurring a couple times a week. Symptoms are moderate in severity but interfering with her quality of life. She denies any other motor or sensory disturbances other than chronic back pain in her upper lumbar spine if she's been lifting heavy objects outside. She denies any radiation of her pain.  A few months ago she noticed a swelling in her right lower quadrant was tender to touch. Ever since it's been painful but the swelling so longer present. It's worse with twisting motions, bearing down for a bowel movement, coughing or sneezing. She denies any gastrointestinal complaints.   Review Of Systems Outlined In HPI  No past medical history on file.  Past Surgical History  Procedure Laterality Date  . Tubal ligation    . Back surgery     Family History  Problem Relation Age of Onset  . Heart attack Father   . Hyperlipidemia Mother     Social History   Social History  . Marital Status: Unknown    Spouse Name: N/A  . Number of Children: N/A  . Years of Education: N/A   Occupational History  . Not on file.   Social History Main Topics  . Smoking status: Never Smoker   . Smokeless tobacco: Not on file  . Alcohol Use: 1.8 oz/week    3 Standard drinks or equivalent per week  . Drug Use: No  . Sexual Activity:    Partners: Male   Other Topics Concern  . Not on  file   Social History Narrative     Objective: BP 119/78 mmHg  Pulse 70  Wt 134 lb (60.782 kg)  General: Alert and Oriented, No Acute Distress HEENT: Pupils equal, round, reactive to light. Conjunctivae clear.  Moist mucous membranes Lungs: Clear to auscultation bilaterally, no wheezing/ronchi/rales.  Comfortable work of breathing. Good air movement. Cardiac: Regular rate and rhythm. Normal S1/S2.  No murmurs, rubs, nor gallops.   Abdomen: Soft with no palpable masses however pain is reproduced with palpating her right lower quadrant. Extremities: No peripheral edema.  Strong peripheral pulses.  Mental Status: No depression, anxiety, nor agitation. Skin: Warm and dry.  Assessment & Plan: Naelle was seen today for fatigue and labs only.  Diagnoses and all orders for this visit:  Hypothyroidism, unspecified hypothyroidism type  Other fatigue -     CBC -     TSH -     VITAMIN D 25 Hydroxy (Vit-D Deficiency, Fractures)  Hot flashes  Compression fracture of L1 lumbar vertebra, sequela  RLQ abdominal pain -     CT Abdomen Pelvis W Contrast   Fatigue: Rule out anemia, untreated hyperthyroidism or vitamin D deficiency. Hot flashes: I'm agreeable to restarting her former estrogen regimen as long as blood work does not look abnormal. Compression fraction of the L1 vertebra: Discussed that  she'll probably have to live with pain when she overdoes it for the rest of her life. Discussed ways to try to take it easy with still being active Right lower quadrant pain: Unable to palpate a hernia however symptoms are suspicious for hernia therefore CT scan of the abdomen has been placed.  Return if symptoms worsen or fail to improve.

## 2016-04-17 ENCOUNTER — Telehealth: Payer: Self-pay | Admitting: Family Medicine

## 2016-04-17 DIAGNOSIS — N951 Menopausal and female climacteric states: Secondary | ICD-10-CM | POA: Insufficient documentation

## 2016-04-17 LAB — COMPLETE METABOLIC PANEL WITH GFR
ALT: 35 U/L — ABNORMAL HIGH (ref 6–29)
AST: 30 U/L (ref 10–35)
Albumin: 4.3 g/dL (ref 3.6–5.1)
Alkaline Phosphatase: 79 U/L (ref 33–130)
BUN: 21 mg/dL (ref 7–25)
CALCIUM: 9.4 mg/dL (ref 8.6–10.4)
CHLORIDE: 105 mmol/L (ref 98–110)
CO2: 24 mmol/L (ref 20–31)
Creat: 0.91 mg/dL (ref 0.60–0.93)
GFR, EST NON AFRICAN AMERICAN: 64 mL/min (ref >=60)
GFR, Est African American: 74 mL/min (ref >=60)
Glucose, Bld: 90 mg/dL (ref 65–99)
POTASSIUM: 4.2 mmol/L (ref 3.5–5.3)
SODIUM: 138 mmol/L (ref 135–146)
Total Bilirubin: 0.8 mg/dL (ref 0.2–1.2)
Total Protein: 6.2 g/dL (ref 6.1–8.1)

## 2016-04-17 LAB — VITAMIN D 25 HYDROXY (VIT D DEFICIENCY, FRACTURES): Vit D, 25-Hydroxy: 40 ng/mL (ref 30–100)

## 2016-04-17 MED ORDER — ESTROGENS CONJUGATED 0.625 MG PO TABS: 0.6250 mg | ORAL_TABLET | Freq: Every day | ORAL | Status: DC

## 2016-04-17 MED ORDER — PROGESTERONE MICRONIZED 100 MG PO CAPS: 100.0000 mg | ORAL_CAPSULE | Freq: Every day | ORAL | Status: DC

## 2016-04-17 NOTE — Telephone Encounter (Signed)
Will you please let patient know that her labs were normal and as we discussed I'd recommend she try restarting hormonal replacement therapy with estrogen.  Since she still has a uterus it would be wise to take a progesterone tablet along with the estrogen I'll send to her [facility]. Please f/u  With me in one month.

## 2016-04-17 NOTE — Telephone Encounter (Signed)
Pt notified of medication changes.

## 2016-04-20 ENCOUNTER — Other Ambulatory Visit: Payer: Self-pay

## 2016-04-20 MED ORDER — PROGESTERONE MICRONIZED 100 MG PO CAPS: 100.0000 mg | ORAL_CAPSULE | Freq: Every day | ORAL | Status: DC

## 2016-04-20 MED ORDER — DONEPEZIL HCL 5 MG PO TABS: 5.0000 mg | ORAL_TABLET | Freq: Every day | ORAL | Status: DC

## 2016-04-20 MED ORDER — ESTROGENS CONJUGATED 0.625 MG PO TABS: 0.6250 mg | ORAL_TABLET | Freq: Every day | ORAL | Status: DC

## 2016-04-23 ENCOUNTER — Ambulatory Visit (HOSPITAL_BASED_OUTPATIENT_CLINIC_OR_DEPARTMENT_OTHER)
Admission: RE | Admit: 2016-04-23 | Discharge: 2016-04-23 | Disposition: A | Discharge status: Home / Self Care | Payer: Medicare HMO | Source: Ambulatory Visit | Attending: Family Medicine | Admitting: Family Medicine

## 2016-04-23 ENCOUNTER — Encounter (HOSPITAL_BASED_OUTPATIENT_CLINIC_OR_DEPARTMENT_OTHER): Payer: Self-pay

## 2016-04-23 DIAGNOSIS — R1031 Right lower quadrant pain: Secondary | ICD-10-CM | POA: Diagnosis present

## 2016-04-23 DIAGNOSIS — K8020 Calculus of gallbladder without cholecystitis without obstruction: Secondary | ICD-10-CM | POA: Insufficient documentation

## 2016-04-23 DIAGNOSIS — N9489 Other specified conditions associated with female genital organs and menstrual cycle: Secondary | ICD-10-CM | POA: Insufficient documentation

## 2016-04-23 MED ORDER — IOPAMIDOL (ISOVUE-300) INJECTION 61%
100.0000 mL | Freq: Once | INTRAVENOUS | Status: AC | PRN
  Administered 2016-04-23: 100 mL via INTRAVENOUS

## 2016-04-23 NOTE — Addendum Note (Signed)
Addended by: Silverio Decamp on: 04/23/2016 11:03 AM   Modules accepted: Orders

## 2016-04-24 ENCOUNTER — Ambulatory Visit (INDEPENDENT_AMBULATORY_CARE_PROVIDER_SITE_OTHER): Payer: Medicare HMO

## 2016-04-24 DIAGNOSIS — N83291 Other ovarian cyst, right side: Secondary | ICD-10-CM | POA: Diagnosis not present

## 2016-04-24 DIAGNOSIS — N9489 Other specified conditions associated with female genital organs and menstrual cycle: Secondary | ICD-10-CM

## 2016-04-30 ENCOUNTER — Telehealth: Payer: Self-pay

## 2016-04-30 NOTE — Telephone Encounter (Signed)
Pt.notified

## 2016-04-30 NOTE — Telephone Encounter (Signed)
Pt started going back to the gym.  She have noticed that her back pain has come back.  Do you have any exercises in mind that she can do to strengthen her back?

## 2016-04-30 NOTE — Telephone Encounter (Signed)
Yes I'll put "low back pain" exercises in your in box for her.

## 2016-05-16 ENCOUNTER — Ambulatory Visit (INDEPENDENT_AMBULATORY_CARE_PROVIDER_SITE_OTHER): Payer: Medicare HMO

## 2016-05-16 ENCOUNTER — Ambulatory Visit (INDEPENDENT_AMBULATORY_CARE_PROVIDER_SITE_OTHER): Payer: Medicare HMO | Admitting: Family Medicine

## 2016-05-16 ENCOUNTER — Encounter: Payer: Self-pay | Admitting: Family Medicine

## 2016-05-16 VITALS — BP 118/75 | HR 83 | Wt 136.0 lb

## 2016-05-16 DIAGNOSIS — M545 Low back pain, unspecified: Secondary | ICD-10-CM

## 2016-05-16 DIAGNOSIS — I998 Other disorder of circulatory system: Secondary | ICD-10-CM | POA: Diagnosis not present

## 2016-05-16 DIAGNOSIS — S32010A Wedge compression fracture of first lumbar vertebra, initial encounter for closed fracture: Secondary | ICD-10-CM | POA: Diagnosis not present

## 2016-05-16 DIAGNOSIS — S32010S Wedge compression fracture of first lumbar vertebra, sequela: Secondary | ICD-10-CM | POA: Diagnosis not present

## 2016-05-16 DIAGNOSIS — M4856XS Collapsed vertebra, not elsewhere classified, lumbar region, sequela of fracture: Secondary | ICD-10-CM | POA: Diagnosis not present

## 2016-05-16 DIAGNOSIS — K8080 Other cholelithiasis without obstruction: Secondary | ICD-10-CM | POA: Diagnosis not present

## 2016-05-16 DIAGNOSIS — M419 Scoliosis, unspecified: Secondary | ICD-10-CM | POA: Diagnosis not present

## 2016-05-16 NOTE — Progress Notes (Signed)
CC: Suzanne Patrick is a 70 y.o. female is here for Back Pain   Subjective: HPI:  Complains of right low back pain present for the last 2-3 weeks. It's worse at the end of the day. Described as a ripping sensation. That's nonradiating. She denies any midline back pain. She denies any motor or sensory disturbances in the lower charities. She is unsure about any recent over exertion but she denies trauma. Symptoms are greatly improved with ice or heating pad. Symptoms are worse when bending forward. Symptoms are worse with twisting. She's tried her best to do the physical therapy exercises but has not been 100% compliant. Denies any abdominal pain or gastrointestinal complaints   Review Of Systems Outlined In HPI  No past medical history on file.  Past Surgical History  Procedure Laterality Date  . Tubal ligation    . Back surgery     Family History  Problem Relation Age of Onset  . Heart attack Father   . Hyperlipidemia Mother     Social History   Social History  . Marital Status: Unknown    Spouse Name: N/A  . Number of Children: N/A  . Years of Education: N/A   Occupational History  . Not on file.   Social History Main Topics  . Smoking status: Never Smoker   . Smokeless tobacco: Not on file  . Alcohol Use: 1.8 oz/week    3 Standard drinks or equivalent per week  . Drug Use: No  . Sexual Activity:    Partners: Male   Other Topics Concern  . Not on file   Social History Narrative     Objective: BP 118/75 mmHg  Pulse 83  Wt 136 lb (61.689 kg)  Vital signs reviewed. General: Alert and Oriented, No Acute Distress HEENT: Pupils equal, round, reactive to light. Conjunctivae clear.  External ears unremarkable.  Moist mucous membranes. Lungs: Clear and comfortable work of breathing, speaking in full sentences without accessory muscle use. Cardiac: Regular rate and rhythm.  Neuro: CN II-XII grossly intact, gait normal. Extremities: No peripheral edema.  Strong  peripheral pulses.  Mental Status: No depression, anxiety, nor agitation. Logical though process. Back: Full range of motion and strength in the lumbar spine, no midline spinous process tenderness. Mild paraspinal musculature tenderness on the right without hypertonicity. Skin: Warm and dry.  Assessment & Plan: Kanon was seen today for back pain.  Diagnoses and all orders for this visit:  Compression fracture of L1 lumbar vertebra, sequela -     DG Lumbar Spine Complete; Future  Right-sided low back pain without sciatica -     DG Lumbar Spine Complete; Future -     Ambulatory referral to Physical Therapy   Right-sided back pain unlikely due to compression fracture given the location and also no pain with pressing on transverse process at L1. I believe her pain is most likely coming from muscular strain, an x-ray was obtained which shows scoliosis, suspect poor mechanics are contributing to her pain, she is open to the idea of formal physical therapy   No Follow-up on file.

## 2016-05-22 ENCOUNTER — Ambulatory Visit (INDEPENDENT_AMBULATORY_CARE_PROVIDER_SITE_OTHER): Payer: Medicare HMO | Admitting: Rehabilitative and Restorative Service Providers"

## 2016-05-22 ENCOUNTER — Encounter: Payer: Self-pay | Admitting: Rehabilitative and Restorative Service Providers"

## 2016-05-22 DIAGNOSIS — M791 Myalgia, unspecified site: Secondary | ICD-10-CM

## 2016-05-22 DIAGNOSIS — M545 Low back pain, unspecified: Secondary | ICD-10-CM

## 2016-05-22 DIAGNOSIS — R29898 Other symptoms and signs involving the musculoskeletal system: Secondary | ICD-10-CM | POA: Diagnosis not present

## 2016-05-22 NOTE — Therapy (Signed)
[facility] [place], [place], [address] Phone: [phone]   Fax:  [phone]  Physical Therapy Evaluation  Patient Details  Name: Suzanne Patrick MRN: [id number] Date of Birth: 09-Jan-1946 Referring Provider: Dr. Marcial Pacas  Encounter Date: 05/22/2016      PT End of Session - 05/22/16 1112    Visit Number 1   Number of Visits 12   Date for PT Re-Evaluation 07/04/16   PT Start Time [id number]   PT Stop Time 1117   PT Time Calculation (min) 62 min   Activity Tolerance Patient tolerated treatment well      History reviewed. No pertinent past medical history.  Past Surgical History  Procedure Laterality Date  . Tubal ligation    . Back surgery      There were no vitals filed for this visit.       Subjective Assessment - 05/22/16 1020    Subjective Patiewnt reports that she has experienced LBP for the past year. She fell while working on a deck with her husband and sustained L1 fracture which has healed. Sh ehas had continued pain since the fall but reports intermittent pain prior to the fall. She can function after using a heating pad and Aleve.   Pertinent History mass growing on ovary  Hx of DDD; spinal stenosis; HNP lumbar spine; cervical disc surgery years ago (pt is a poor historian)    How long can you sit comfortably? 2 hours with heating pad    How long can you stand comfortably? 1 hour    How long can you walk comfortably? 1 hour    Diagnostic tests xrays; MRI    Patient Stated Goals get rid of pain and make back strong again - increase strength    Currently in Pain? Yes   Pain Score 3    Pain Location Back   Pain Orientation Lower;Right;Left   Pain Descriptors / Indicators Dull;Nagging;Aching   Pain Type Chronic pain   Pain Radiating Towards 1-2 times pain down the Rt LE to knee    Pain Onset More than a month ago   Aggravating Factors  household chores; ironing; bending; lifting;  reaching    Pain Relieving Factors Aleve; heating pad             [facility] PT Assessment - 05/22/16 0001    Assessment   Medical Diagnosis LBP    Referring Provider Dr. Marcial Pacas   Onset Date/Surgical Date 04/13/15   Hand Dominance Right   Next MD Visit no return scheduled    Precautions   Precautions None   Restrictions   Weight Bearing Restrictions No   Balance Screen   Has the patient fallen in the past 6 months No   Has the patient had a decrease in activity level because of a fear of falling?  No   Is the patient reluctant to leave their home because of a fear of falling?  No   Home Environment   Additional Comments single level home    Prior Function   Level of Independence Independent   Vocation Retired   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    Leisure household chores; Field seismologist; walking 1 x every 2 weeks around block; otherwise sedentary    Observation/Other Assessments   Focus on Therapeutic Outcomes (FOTO)  54% limitation    Sensation   Additional Comments WFL's per pt report    Posture/Postural  Control   Posture Comments head forward; shoudlers rounded; flexed forward at hips; trunk shifted to the Lt;    AROM   Lumbar Flexion 80%   Lumbar Extension 20%   Lumbar - Right Side Bend 70%   Lumbar - Left Side Bend 70%   Lumbar - Right Rotation 20%   Lumbar - Left Rotation 15%   Strength   Overall Strength Comments bilat LE's 4+/5 to 5-/5 except hip ext 4/5    Palpation   Spinal mobility decreased mobility through thoracic and lumbar spine with CPA and UPA mobs Grade II/III    Palpation comment muscular tightness through the Lt lumbar paraspinals; Rt QL    Ambulation/Gait   Gait Comments ambulates with flexed forward trunk    Balance   Balance Assessed --  SLS Rt 8 sec; Lt 5 sec                    [facility] Adult PT Treatment/Exercise - 05/22/16 0001    Self-Care   Self-Care --  started education re  back care/transitionsl movements   Lumbar Exercises: Supine   Ab Set --  3 part core 10 sec x 10    AB Set Limitations some upper and then LBP with core - adjusted pt effort and she tolerated core exercise well    Moist Heat Therapy   Number Minutes Moist Heat 20 Minutes   Moist Heat Location Lumbar Spine   Electrical Stimulation   Electrical Stimulation Location bilat thoracolumbar paraspinals    Electrical Stimulation Action IFC   Electrical Stimulation Parameters to tolerance    Electrical Stimulation Goals Pain;Tone  muscular tightness                 PT Education - 05/22/16 1107    Education provided Yes   Education Details use of heating pad at home - encouraged pt to avoid using heating pad more than 30 min and not to sleep with heating pad; started spine care ed; HEP: TENS info    Person(s) Educated Patient   Methods Explanation;Demonstration;Tactile cues;Verbal cues;Handout   Comprehension Verbalized understanding;Returned demonstration;Verbal cues required;Tactile cues required             PT Long Term Goals - 05/22/16 1117    PT LONG TERM GOAL #1   Title Improve lumbar extension to 40-50% assessec in standing 07/04/16   Time 6   Period Weeks   Status New   PT LONG TERM GOAL #2   Title Improve core stability and strength allowing pt to perform exercise and walking program as well as increase tolerance for functional activities 07/04/16   Time 6   Period Weeks   Status New   PT LONG TERM GOAL #3   Title Increase activity level with patient working in a community based exercise program 2-3 times per week 07/04/16   Time 6   Period Weeks   Status New   PT LONG TERM GOAL #4   Title Independent in HEP 07/04/16   Time 6   Period Weeks   Status New   PT LONG TERM GOAL #5   Title Improve FOTO to </= 37% limitation 07/04/16   Time 6   Period Weeks   Status New               Plan - 05/22/16 1112    Clinical Impression Statement Patient presents  with signs and symptoms consistent chronic LBP including poor posture and alignment; limited trunk  mobilty and ROM; decresaed segmental mobilty thorugh the lumbar and thoracic spine; poor trunk extension; muscular tightness through the lumbar area; LE weakness; poor core stability and strength; pain with functional activities; poor back care and body mechanics.    Rehab Potential Good   PT Frequency 2x / week   PT Duration 6 weeks   PT Treatment/Interventions Patient/family education;ADLs/Self Care Home Management;Neuromuscular re-education;Electrical Stimulation;Cryotherapy;Iontophoresis 4mg /ml Dexamethasone;Moist Heat;Ultrasound;Manual techniques;Dry needling;Therapeutic activities;Therapeutic exercise   PT Next Visit Plan gentle trunk extension/moblity; core stabilization; balance activities; manual therapy vs TDN to muscular tightness; modalities as indicated; spinal education    Consulted and Agree with Plan of Care Patient      Patient will benefit from skilled therapeutic intervention in order to improve the following deficits and impairments:  Postural dysfunction, Improper body mechanics, Pain, Decreased range of motion, Decreased strength, Decreased mobility, Increased fascial restricitons, Increased muscle spasms, Decreased activity tolerance  Visit Diagnosis: Bilateral low back pain without sciatica - Plan: PT plan of care cert/re-cert  Myalgia - Plan: PT plan of care cert/re-cert  Other symptoms and signs involving the musculoskeletal system - Plan: PT plan of care cert/re-cert     Problem List Patient Active Problem List   Diagnosis Date Noted  . Adnexal mass 04/23/2016  . 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 Chronic tonsillitis 11/02/2015  . 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 Hypothyroidism 04/01/2015  . Hyperlipidemia 04/01/2015  . Depression 04/01/2015  . Anemia 04/01/2015  . Compression fracture of L1 lumbar vertebra (HCC) 04/01/2015     Lillee Mooneyhan Nilda Simmer PT, MPH  05/22/2016, 11:31 AM  [facility] [place], [place], [address] Phone: [phone]   Fax:  [phone]  Name: Suzanne Patrick MRN: [id number] Date of Birth: Dec 27, 1945

## 2016-05-22 NOTE — Patient Instructions (Signed)
Abdominal Bracing With Pelvic Floor (Hook-Lying)    With neutral spine, tighten pelvic floor and abdominals sucking belly button to back bone; tighten muscles in low back at wiast. Hold 10 sec  Repeat _10__ times. Do _several__ times a day. Progress to do this in sitting; standing; walking; with functional activities   TENS UNIT: This is helpful for muscle pain and spasm.   Search and Purchase a TENS 7000 2nd edition at www.tenspros.com. It should be less than $30.     TENS unit instructions: Do not shower or bathe with the unit on Turn the unit off before removing electrodes or batteries If the electrodes lose stickiness add a drop of water to the electrodes after they are disconnected from the unit and place on plastic sheet. If you continued to have difficulty, call the TENS unit company to purchase more electrodes. Do not apply lotion on the skin area prior to use. Make sure the skin is clean and dry as this will help prolong the life of the electrodes. After use, always check skin for unusual red areas, rash or other skin difficulties. If there are any skin problems, does not apply electrodes to the same area. Never remove the electrodes from the unit by pulling the wires. Do not use the TENS unit or electrodes other than as directed. Do not change electrode placement without consultating your therapist or physician. Keep 2 fingers with between each electrode.    Sleeping on Back  Place pillow under knees. A pillow with cervical support and a roll around waist are also helpful. Copyright  VHI. All rights reserved.  Sleeping on Side Place pillow between knees. Use cervical support under neck and a roll around waist as needed. Copyright  VHI. All rights reserved.   Sleeping on Stomach   If this is the only desirable sleeping position, place pillow under lower legs, and under stomach or chest as needed.  Posture - Sitting   Sit upright, head facing forward. Try using a  roll to support lower back. Keep shoulders relaxed, and avoid rounded back. Keep hips level with knees. Avoid crossing legs for long periods. Stand to Sit / Sit to Stand   To sit: Bend knees to lower self onto front edge of chair, then scoot back on seat. To stand: Reverse sequence by placing one foot forward, and scoot to front of seat. Use rocking motion to stand up.   Work Height and Reach  Ideal work height is no more than 2 to 4 inches below elbow level when standing, and at elbow level when sitting. Reaching should be limited to arm's length, with elbows slightly bent.  Bending  Bend at hips and knees, not back. Keep feet shoulder-width apart.    Posture - Standing   Good posture is important. Avoid slouching and forward head thrust. Maintain curve in low back and align ears over shoul- ders, hips over ankles.  Alternating Positions   Alternate tasks and change positions frequently to reduce fatigue and muscle tension. Take rest breaks. Computer Work   Position work to Programmer, multimedia. Use proper work and seat height. Keep shoulders back and down, wrists straight, and elbows at right angles. Use chair that provides full back support. Add footrest and lumbar roll as needed.  Getting Into / Out of Car  Lower self onto seat, scoot back, then bring in one leg at a time. Reverse sequence to get out.  Dressing  Lie on back to pull socks or slacks over  feet, or sit and bend leg while keeping back straight.    Housework - Sink  Place one foot on ledge of cabinet under sink when standing at sink for prolonged periods.   Pushing / Pulling  Pushing is preferable to pulling. Keep back in proper alignment, and use leg muscles to do the work.  Deep Squat   Squat and lift with both arms held against upper trunk. Tighten stomach muscles without holding breath. Use smooth movements to avoid jerking.  Avoid Twisting   Avoid twisting or bending back. Pivot around using foot  movements, and bend at knees if needed when reaching for articles.  Carrying Luggage   Distribute weight evenly on both sides. Use a cart whenever possible. Do not twist trunk. Move body as a unit.   Lifting Principles .Maintain proper posture and head alignment. .Slide object as close as possible before lifting. .Move obstacles out of the way. .Test before lifting; ask for help if too heavy. .Tighten stomach muscles without holding breath. .Use smooth movements; do not jerk. .Use legs to do the work, and pivot with feet. .Distribute the work load symmetrically and close to the center of trunk. .Push instead of pull whenever possible.   Ask For Help   Ask for help and delegate to others when possible. Coordinate your movements when lifting together, and maintain the low back curve.  Log Roll   Lying on back, bend left knee and place left arm across chest. Roll all in one movement to the right. Reverse to roll to the left. Always move as one unit. Housework - Sweeping  Use long-handled equipment to avoid stooping.   Housework - Wiping  Position yourself as close as possible to reach work surface. Avoid straining your back.  Laundry - Unloading Wash   To unload small items at bottom of washer, lift leg opposite to arm being used to reach.  Iago close to area to be raked. Use arm movements to do the work. Keep back straight and avoid twisting.     Cart  When reaching into cart with one arm, lift opposite leg to keep back straight.   Getting Into / Out of Bed  Lower self to lie down on one side by raising legs and lowering head at the same time. Use arms to assist moving without twisting. Bend both knees to roll onto back if desired. To sit up, start from lying on side, and use same move-ments in reverse. Housework - Vacuuming  Hold the vacuum with arm held at side. Step back and forth to move it, keeping head up. Avoid twisting.   Laundry -  IT consultant so that bending and twisting can be avoided.   Laundry - Unloading Dryer  Squat down to reach into clothes dryer or use a reacher.  Gardening - Weeding / Probation officer or Kneel. Knee pads may be helpful.

## 2016-06-04 ENCOUNTER — Encounter (INDEPENDENT_AMBULATORY_CARE_PROVIDER_SITE_OTHER): Payer: Self-pay

## 2016-06-04 ENCOUNTER — Ambulatory Visit (INDEPENDENT_AMBULATORY_CARE_PROVIDER_SITE_OTHER): Payer: Medicare HMO | Admitting: Physical Therapy

## 2016-06-04 DIAGNOSIS — M791 Myalgia, unspecified site: Secondary | ICD-10-CM

## 2016-06-04 DIAGNOSIS — M545 Low back pain, unspecified: Secondary | ICD-10-CM

## 2016-06-04 DIAGNOSIS — R29898 Other symptoms and signs involving the musculoskeletal system: Secondary | ICD-10-CM | POA: Diagnosis not present

## 2016-06-04 NOTE — Patient Instructions (Signed)
Unilateral Isometric Hip Flexion    Tighten stomach and raise right knee to outstretched arm. Push gently, keeping arm straight, trunk rigid. Hold __2__ seconds. Repeat __10__ times per set. Do _1___ sets per session. Do ___1_ sessions per day.  http://orth.exer.us/1099   Hamstring Step 1    Straighten left knee. Keep knee level with other knee or on bolster. Hold _30__ seconds. Relax knee by returning foot to start. Repeat _2__ times.  Scapular Retraction (Standing)    With arms at sides, pinch shoulder blades together. Hold 5 seconds.  Repeat _10___ times per set. Do _1___ sets per session. Do __1__ sessions per day.  Trunk Extension    Standing, place back of open hands on low back. Straighten spine then arch the back and move shoulders back. Hold 2 seconds. Repeat _5___ times per session. Variation: Seated   ** self massage with ball to mid and low back muscles, 3-5 min each day.

## 2016-06-04 NOTE — Therapy (Signed)
Suzanne Patrick [place], [place], [address] Phone: [phone]   Fax:  [phone]  Physical Therapy Treatment  Patient Details  Name: Suzanne Patrick MRN: [id number] Date of Birth: 23-May-1946 Referring Provider: Dr. Ileene Rubens   Encounter Date: 06/04/2016      PT End of Session - 06/04/16 0930    Visit Number 2   Number of Visits 12   Date for PT Re-Evaluation 07/04/16   PT Start Time [id number]   PT Stop Time 0938   PT Time Calculation (min) 51 min   Activity Tolerance Patient tolerated treatment well;No increased pain      No past medical history on file.  Past Surgical History:  Procedure Laterality Date  . BACK SURGERY    . TUBAL LIGATION      There were no vitals filed for this visit.      Subjective Assessment - 06/04/16 0849    Subjective Pt reports she wasn't able to do her exercises due to having company over the last week.  Woke up with 6/10 pain this morning.  Pt reports her daughter is helping her clean now, has helped decrease back pain.    Pertinent History mass growing on ovary  Hx of DDD; spinal stenosis; HNP lumbar spine; cervical disc surgery years ago (pt is a poor historian)    Currently in Pain? Yes   Pain Score 4   took 2 aleve hour before therapy session   Aggravating Factors  household chores    Pain Relieving Factors Aleve, heating pad             [facility] PT Assessment - 06/04/16 0001      Assessment   Medical Diagnosis LBP    Referring Provider Dr. Ileene Rubens    Next MD Visit PRN                     [facility] Adult PT Treatment/Exercise - 06/04/16 0001      Self-Care   Self-Care Posture;Other Self-Care Comments   Other Self-Care Comments  Pt instructed in proper body mechanics with vacuuming and sweeping.  Pt verbalized understanding, returned demo with VC.  Pt educated on self massage with ball to thoracic paraspinals. Pt returned demo.       Exercises   Exercises Lumbar;Shoulder     Lumbar Exercises: Stretches   Passive Hamstring Stretch 2 reps;30 seconds     Lumbar Exercises: Standing   Other Standing Lumbar Exercises lumbar extension with hands on hips x 3 sec hold x 6 reps     Lumbar Exercises: Supine   Ab Set 10 reps;5 seconds   AB Set Limitations multiple tactile and VC to engage.    Isometric Hip Flexion 10 reps;2 seconds   Isometric Hip Flexion Limitations cues for counting reps      Shoulder Exercises: Seated   Other Seated Exercises scap squeeze 5 sec hold x 10 reps. VC not to hold breath     Moist Heat Therapy   Number Minutes Moist Heat 15 Minutes   Moist Heat Location Lumbar Spine     Electrical Stimulation   Electrical Stimulation Location Rt thoracic and lumbar paraspinals    Electrical Stimulation Action IFC   Electrical Stimulation Parameters to tolerance    Electrical Stimulation Goals Tone;Pain                PT Education - 06/04/16 0929    Education provided Yes   Education Details  HEP    Person(s) Educated Patient   Methods Explanation;Handout;Demonstration;Tactile cues;Verbal cues   Comprehension Verbalized understanding;Returned demonstration             PT Long Term Goals - 06/04/16 1058      PT LONG TERM GOAL #1   Title Improve lumbar extension to 40-50% assessec in standing 07/04/16   Time 6   Period Weeks   Status On-going     PT LONG TERM GOAL #2   Title Improve core stability and strength allowing pt to perform exercise and walking program as well as increase tolerance for functional activities 07/04/16   Time 6   Period Weeks   Status On-going     PT LONG TERM GOAL #3   Title Increase activity level with patient working in a community based exercise program 2-3 times per week 07/04/16   Time 6   Period Weeks   Status On-going     PT LONG TERM GOAL #4   Title Independent in HEP 07/04/16   Time 6   Period Weeks   Status On-going     PT LONG TERM GOAL #5   Title  Improve FOTO to </= 37% limitation 07/04/16   Time 6   Period Weeks   Status On-going               Plan - 06/04/16 0932    Clinical Impression Statement Pt tolerated all exercises well, without increase in symptoms. Pt required frequent cues for counting rep, and speed of counting.  She also required multiple cues for engaging core muscles.  Pt reported decrease in back pain with ther ex and further reduction with use of estim/MHP.     Rehab Potential Good   PT Frequency 2x / week   PT Duration 6 weeks   PT Treatment/Interventions Patient/family education;ADLs/Self Care Home Management;Neuromuscular re-education;Electrical Stimulation;Cryotherapy;Iontophoresis 4mg /ml Dexamethasone;Moist Heat;Ultrasound;Manual techniques;Dry needling;Therapeutic activities;Therapeutic exercise   PT Next Visit Plan Review exercises, continue ed on body mechanics and core stabilization.  Modalities as indicated.    Consulted and Agree with Plan of Care Patient      Patient will benefit from skilled therapeutic intervention in order to improve the following deficits and impairments:  Postural dysfunction, Improper body mechanics, Pain, Decreased range of motion, Decreased strength, Decreased mobility, Increased fascial restricitons, Increased muscle spasms, Decreased activity tolerance  Visit Diagnosis: Bilateral low back pain without sciatica  Myalgia  Other symptoms and signs involving the musculoskeletal system     Problem List Patient Active Problem List   Diagnosis Date Noted  . Adnexal mass 04/23/2016  . 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 Chronic tonsillitis 11/02/2015  . 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 Hypothyroidism 04/01/2015  . Hyperlipidemia 04/01/2015  . Depression 04/01/2015  . Anemia 04/01/2015  . Compression fracture of L1 lumbar vertebra [facility]) 04/01/2015   Suzanne Patrick, PTA 06/04/16 11:00 AM  [place], [place], [address] Phone: [phone]   Fax:  [phone]  Name: Suzanne Patrick MRN: [id number] Date of Birth: 1946/02/12

## 2016-06-07 ENCOUNTER — Ambulatory Visit (INDEPENDENT_AMBULATORY_CARE_PROVIDER_SITE_OTHER): Payer: Medicare HMO | Admitting: Physical Therapy

## 2016-06-07 DIAGNOSIS — M791 Myalgia, unspecified site: Secondary | ICD-10-CM

## 2016-06-07 DIAGNOSIS — R29898 Other symptoms and signs involving the musculoskeletal system: Secondary | ICD-10-CM | POA: Diagnosis not present

## 2016-06-07 DIAGNOSIS — M545 Low back pain, unspecified: Secondary | ICD-10-CM

## 2016-06-07 NOTE — Therapy (Signed)
[place], [place], [address] Phone: [phone]   Fax:  [phone]  Physical Therapy Treatment  Patient Details  Name: Suzanne Patrick MRN: [id number] Date of Birth: 16-May-1946 Referring Provider: Dr. Ileene Rubens   Encounter Date: 06/07/2016      PT End of Session - 06/07/16 0937    Visit Number 3   Number of Visits 12   Date for PT Re-Evaluation 07/04/16   PT Start Time [id number]   PT Stop Time 0945   PT Time Calculation (min) 51 min   Activity Tolerance Patient tolerated treatment well      No past medical history on file.  Past Surgical History:  Procedure Laterality Date  . BACK SURGERY    . TUBAL LIGATION      There were no vitals filed for this visit.      Subjective Assessment - 06/07/16 0846    Subjective Pt reports she has been tightening her abdominals when driving and sitting at the kitchen table, "and I have been breathing when doing it".  Her pain is "not enough to take an Aleve"    Currently in Pain? Yes   Pain Score 2    Pain Location Back   Pain Orientation Right;Left;Lower   Pain Descriptors / Indicators Dull   Aggravating Factors  household chores   Pain Relieving Factors Alleve, heating pad.             [facility] PT Assessment - 06/07/16 0001      Assessment   Medical Diagnosis LBP    Referring Provider Dr. Ileene Rubens    Next MD Visit PRN          [facility] Adult PT Treatment/Exercise - 06/07/16 0001      Lumbar Exercises: Stretches   Passive Hamstring Stretch 2 reps;30 seconds   Lower Trunk Rotation 3 reps  5 sec      Lumbar Exercises: Supine   Ab Set 5 reps;5 seconds   AB Set Limitations tactile cues   Bridge 10 reps;3 seconds   Isometric Hip Flexion 10 reps;2 seconds     Lumbar Exercises: Prone   Single Arm Raise Right;Left;5 reps   Straight Leg Raise 10 reps   Opposite Arm/Leg Raise Right arm/Left leg;Left arm/Right leg;5 reps     Lumbar Exercises:  Quadruped   Madcat/Old Horse 5 reps   Madcat/Old Horse Limitations limited extension, tactile cues for form     Moist Heat Therapy   Number Minutes Moist Heat 15 Minutes   Moist Heat Location Lumbar Spine     Electrical Stimulation   Electrical Stimulation Location Rt thoracic and lumbar paraspinals    Electrical Stimulation Action IFC   Electrical Stimulation Parameters to tolerance    Electrical Stimulation Goals Tone;Pain     Manual Therapy   Manual Therapy Soft tissue mobilization   Manual therapy comments prone    Soft tissue mobilization TPR to thoracic paraspinals (bilat) with gentle oscillation           PT Long Term Goals - 06/04/16 1058      PT LONG TERM GOAL #1   Title Improve lumbar extension to 40-50% assessec in standing 07/04/16   Time 6   Period Weeks   Status On-going     PT LONG TERM GOAL #2   Title Improve core stability and strength allowing pt to perform exercise and walking program as well as increase tolerance for functional activities 07/04/16   Time 6  Period Weeks   Status On-going     PT LONG TERM GOAL #3   Title Increase activity level with patient working in a community based exercise program 2-3 times per week 07/04/16   Time 6   Period Weeks   Status On-going     PT LONG TERM GOAL #4   Title Independent in HEP 07/04/16   Time 6   Period Weeks   Status On-going     PT LONG TERM GOAL #5   Title Improve FOTO to </= 37% limitation 07/04/16   Time 6   Period Weeks   Status On-going               Plan - 06/07/16 0856    Clinical Impression Statement Pt demonstrates fair TA muscle, although pt feels it is a strong contraction. Continues to require assistance with counting reps.  She reported after exercise she had mild increase in pain with prone hip ext and opp arm/leg.  Discomfort decreased with manual therapy and MHP/estim.     Rehab Potential Good   PT Frequency 2x / week   PT Duration 6 weeks   PT Treatment/Interventions  Patient/family education;ADLs/Self Care Home Management;Neuromuscular re-education;Electrical Stimulation;Cryotherapy;Iontophoresis 4mg /ml Dexamethasone;Moist Heat;Ultrasound;Manual techniques;Dry needling;Therapeutic activities;Therapeutic exercise   PT Next Visit Plan  continue progressive core stabilization.  Modalities as indicated.    Consulted and Agree with Plan of Care Patient      Patient will benefit from skilled therapeutic intervention in order to improve the following deficits and impairments:  Postural dysfunction, Improper body mechanics, Pain, Decreased range of motion, Decreased strength, Decreased mobility, Increased fascial restricitons, Increased muscle spasms, Decreased activity tolerance  Visit Diagnosis: Bilateral low back pain without sciatica  Myalgia  Other symptoms and signs involving the musculoskeletal system     Problem List Patient Active Problem List   Diagnosis Date Noted  . Adnexal mass 04/23/2016  . 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 Chronic tonsillitis 11/02/2015  . 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 Hypothyroidism 04/01/2015  . Hyperlipidemia 04/01/2015  . Depression 04/01/2015  . Anemia 04/01/2015  . Compression fracture of L1 lumbar vertebra [facility]) 04/01/2015   Suzanne Patrick, PTA 06/07/16 1:59 PM  [place], [place], [address] Phone: [phone]   Fax:  [phone]  Name: Suzanne Patrick MRN: [id number] Date of Birth: Mar 24, 1946

## 2016-06-11 ENCOUNTER — Ambulatory Visit (INDEPENDENT_AMBULATORY_CARE_PROVIDER_SITE_OTHER): Payer: Medicare HMO | Admitting: Rehabilitative and Restorative Service Providers"

## 2016-06-11 ENCOUNTER — Encounter: Payer: Self-pay | Admitting: Rehabilitative and Restorative Service Providers"

## 2016-06-11 ENCOUNTER — Emergency Department (INDEPENDENT_AMBULATORY_CARE_PROVIDER_SITE_OTHER)
Admission: EM | Admit: 2016-06-11 | Discharge: 2016-06-11 | Disposition: A | Discharge status: Home / Self Care | Payer: Medicare HMO | Source: Home / Self Care | Attending: Emergency Medicine | Admitting: Emergency Medicine

## 2016-06-11 ENCOUNTER — Encounter: Payer: Self-pay | Admitting: *Deleted

## 2016-06-11 DIAGNOSIS — M545 Low back pain, unspecified: Secondary | ICD-10-CM

## 2016-06-11 DIAGNOSIS — R112 Nausea with vomiting, unspecified: Secondary | ICD-10-CM

## 2016-06-11 DIAGNOSIS — M791 Myalgia, unspecified site: Secondary | ICD-10-CM

## 2016-06-11 DIAGNOSIS — R55 Syncope and collapse: Secondary | ICD-10-CM

## 2016-06-11 DIAGNOSIS — R531 Weakness: Secondary | ICD-10-CM

## 2016-06-11 DIAGNOSIS — R29898 Other symptoms and signs involving the musculoskeletal system: Secondary | ICD-10-CM | POA: Diagnosis not present

## 2016-06-11 LAB — COMPLETE METABOLIC PANEL WITH GFR
ALT: 21 U/L (ref 6–29)
AST: 25 U/L (ref 10–35)
Albumin: 4.4 g/dL (ref 3.6–5.1)
Alkaline Phosphatase: 75 U/L (ref 33–130)
BUN: 24 mg/dL (ref 7–25)
CO2: 22 mmol/L (ref 20–31)
Calcium: 9.5 mg/dL (ref 8.6–10.4)
Chloride: 103 mmol/L (ref 98–110)
Creat: 0.93 mg/dL (ref 0.60–0.93)
GFR, Est African American: 72 mL/min (ref >=60)
GFR, Est Non African American: 62 mL/min (ref >=60)
Glucose, Bld: 99 mg/dL (ref 65–99)
Potassium: 4.8 mmol/L (ref 3.5–5.3)
Sodium: 136 mmol/L (ref 135–146)
Total Bilirubin: 0.6 mg/dL (ref 0.2–1.2)
Total Protein: 6.4 g/dL (ref 6.1–8.1)

## 2016-06-11 LAB — POCT CBC W AUTO DIFF (K'VILLE URGENT CARE)

## 2016-06-11 NOTE — ED Triage Notes (Signed)
Patient reports while @ PT this morning post acupuncture, she became nauseated, vomited once and is now weak with a mild HA. This was her first time doing acupuncture and se has not eaten today. Nausea has resolved.

## 2016-06-11 NOTE — ED Provider Notes (Signed)
Suzanne Patrick CARE    CSN: [id number] Arrival date & time: 06/11/16  1016  First Provider Contact:  First MD Initiated Contact with Patient 06/11/16 1101        History   Chief Complaint Chief Complaint  Patient presents with  . Weakness    HPI Suzanne Patrick is a 70 y.o. female.  Here with husband. Right after having dry needling in physical therapy this morning, she felt nauseated, vomited times one. Nausea has gradually improved. May have had some minimal abdominal discomfort but that resolved. She felt lightheaded, but feeling of lightheadedness has resolved. Has minimal headache, now improving. Denies focal neurologic symptoms. No chest pain or shortness of breath or vision change. She never lost consciousness. Has not tried any medication for this. Denies fever or chills or sweats. Denies change of bowel habits or loose stools. Denies blood in stool.   The history is provided by the patient. No language interpreter was used.  Emesis  Severity:  Mild Quality:  Undigested food Progression:  Improving Chronicity:  New Recent urination:  Normal Context comment:  Right after having dry needling at physical therapy this morning, on an empty stomach Worsened by:  Nothing Ineffective treatments:  None tried Associated symptoms: abdominal pain   Associated symptoms: no chills, no cough, no diarrhea, no fever, no headaches, no myalgias, no sore throat and no URI   Risk factors: no travel to endemic areas     History reviewed. No pertinent past medical history. Past medical history: No history of heart problems or seizures Patient Active Problem List   Diagnosis Date Noted  . Adnexal mass 04/23/2016  . 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 Chronic tonsillitis 11/02/2015  . 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 Hypothyroidism 04/01/2015  . Hyperlipidemia 04/01/2015  . Depression 04/01/2015  . Anemia 04/01/2015  . Compression fracture of L1  lumbar vertebra ([place]) 04/01/2015    Past Surgical History:  Procedure Laterality Date  . BACK SURGERY    . TUBAL LIGATION      OB History    No data available       Home Medications    Prior to Admission medications   Medication Sig Start Date End Date Taking? Authorizing Provider  aspirin 81 MG EC tablet Take 81 mg by mouth.    Historical Provider, MD  atorvastatin (LIPITOR) 40 MG tablet Take 1 tablet (40 mg total) by mouth daily. 03/28/16   Sean Hommel, DO  citalopram (CELEXA) 20 MG tablet Take 1 tablet (20 mg total) by mouth daily. To help with mood. 03/28/16   Sean Hommel, DO  donepezil (ARICEPT) 5 MG tablet Take 1 tablet (5 mg total) by mouth at bedtime. For Memory 04/20/16   Marcial Pacas, DO  estrogens, conjugated, (PREMARIN) 0.625 MG tablet Take 1 tablet (0.625 mg total) by mouth daily. Take along with progesterone pill. 04/20/16   Marcial Pacas, DO  levothyroxine (SYNTHROID, LEVOTHROID) 75 MCG tablet Take 1 tablet (75 mcg total) by mouth daily before breakfast. 03/28/16   Marcial Pacas, DO  lidocaine (XYLOCAINE) 2 % solution 81mL gargled in the mouth then swallowed every six hours only as needed for throat pain. 12/08/15   Marcial Pacas, DO  meloxicam (MOBIC) 15 MG tablet Take 1 tablet (15 mg total) by mouth daily as needed (sore throat). 09/19/15   Sean Hommel, DO  omeprazole (PRILOSEC) 20 MG capsule Take 1 capsule (20 mg total) by mouth daily. 03/28/16   Marcial Pacas, DO  progesterone (PROMETRIUM) 100 MG capsule Take 1 capsule (100 mg total) by mouth daily. Take with estrogen. 04/20/16   Marcial Pacas, DO    Family History Family History  Problem Relation Age of Onset  . Heart attack Father   . Hyperlipidemia Mother     Social History Social History  Substance Use Topics  . Smoking status: Never Smoker  . Smokeless tobacco: Never Used  . Alcohol use 1.8 oz/week    3 Standard drinks or equivalent per week     Allergies   Bee venom   Review of Systems Review of Systems    Constitutional: Negative for chills, diaphoresis and fever.  HENT: Negative for sore throat.   Respiratory: Negative for cough.   Gastrointestinal: Positive for abdominal pain and vomiting. Negative for diarrhea.  Genitourinary: Negative for difficulty urinating, dysuria and frequency.  Musculoskeletal: Negative for myalgias.  Neurological: Negative for facial asymmetry and headaches.  Psychiatric/Behavioral: Negative for confusion.  All other systems reviewed and are negative. The abdominal pain has since resolved.   Physical Exam Triage Vital Signs ED Triage Vitals  Enc Vitals Group     BP 06/11/16 1050 123/77     Pulse Rate 06/11/16 1050 70     Resp 06/11/16 1050 16     Temp 06/11/16 1050 97.5 F (36.4 C)     Temp Source 06/11/16 1050 Oral     SpO2 06/11/16 1050 100 %     Weight 06/11/16 1050 129 lb (58.5 kg)     Height 06/11/16 1050 5\' 5"  (1.651 m)     Head Circumference --      Peak Flow --      Pain Score 06/11/16 1051 3     Pain Loc --      Pain Edu? --      Excl. in [place]? --    No data found.   Updated Vital Signs BP 117/79   Pulse 70   Temp 97.5 F (36.4 C) (Oral)   Resp 16   Ht 5\' 5"  (1.651 m)   Wt 129 lb (58.5 kg)   SpO2 100%   BMI 21.47 kg/m   Visual Acuity Right Eye Distance:   Left Eye Distance:   Bilateral Distance:    Right Eye Near:   Left Eye Near:    Bilateral Near:     Physical Exam  Constitutional: She is oriented to person, place, and time. She appears well-developed and well-nourished. No distress.  HENT:  Head: Normocephalic and atraumatic.  Nose: Nose normal.  Mouth/Throat: Oropharynx is clear and moist.  Eyes: Conjunctivae and EOM are normal. Pupils are equal, round, and reactive to light. No scleral icterus.  Neck: Normal range of motion. Neck supple. No JVD present.  Cardiovascular: Normal rate, regular rhythm, normal heart sounds and intact distal pulses.  Exam reveals no gallop and no friction rub.   No murmur  heard. Pulmonary/Chest: Effort normal and breath sounds normal. No stridor.  Abdominal: Soft. She exhibits no distension and no mass. There is no tenderness. There is no rebound and no guarding.  Musculoskeletal: Normal range of motion. She exhibits no edema or tenderness.  Lymphadenopathy:    She has no cervical adenopathy.  Neurological: She is alert and oriented to person, place, and time. She has normal reflexes. No cranial nerve deficit. Coordination normal.  Skin: Skin is warm and dry. Capillary refill takes less than 2 seconds. No rash noted.  Psychiatric: She has a normal mood and affect.  Nursing note and vitals reviewed. No orthostatic signs or symptoms currently.   UC Treatments / Results  Labs (all labs ordered are listed, but only abnormal results are displayed) Labs Reviewed  COMPLETE METABOLIC PANEL WITH GFR  POCT CBC W AUTO DIFF ([facility])    EKG  EKG Interpretation None       Radiology No results found.  Procedures Procedures (including critical care time)  Medications Ordered in UC Medications - No data to display   Initial Impression / Assessment and Plan / UC Course  I have reviewed the triage vital signs and the nursing notes.  Pertinent labs & imaging results that were available during my care of the patient were reviewed by me and considered in my medical decision making (see chart for details).  Clinical Course  Comment By Time  CBC today within normal limits. Hemoglobin 12.2, WBC 5.9. CMP sent to reference lab. Patient was given 6 ounces of ginger ale which she sipped by mouth and tolerated well. Nausea resolved. Abdominal discomfort resolved. Overall she felt better at time of discharge. No lightheadedness or syncope. Jacqulyn Cane, MD 07/31 1147  She was able to ambulate normally.  At time of discharge, she was feeling much better. No vomiting observed here in urgent care. Nausea resolved. Vital signs were stable. It was felt that  most likely she had vasovagal episode and nausea, vomiting 1 after dry needling procedure in physical therapy, but those symptoms have since resolved.  Advised to drink plenty of fluids and eat bland food today. Follow-up with your primary care doctor in 5-7 days if not improving, or sooner if symptoms become worse. Precautions discussed. Red flags discussed. Questions invited and answered. They voiced understanding and agreement.   Final Clinical Impressions(s) / UC Diagnoses   Final diagnoses:  Weakness  Vasovagal near-syncope  Nausea and vomiting, vomiting of unspecified type  The nausea and vomiting has since resolved.  New Prescriptions Discharge Medication List as of 06/11/2016 11:48 AM    No new prescriptions.--I offered prescription for Zofran, but she declined at this time. Over 45 minutes spent, greater than 50% of the time spent for counseling and coordination of care.      Jacqulyn Cane, MD 06/11/16 [phone]

## 2016-06-11 NOTE — Therapy (Signed)
[facility] [place], [place], [address] Phone: [phone]   Fax:  [phone]  Physical Therapy Treatment  Patient Details  Name: Suzanne Patrick MRN: [id number] Date of Birth: [birth year] Referring Provider: Dr. Ileene Rubens   Encounter Date: 06/11/2016      PT End of Session - 06/11/16 0850    Visit Number 4   Number of Visits 12   Date for PT Re-Evaluation 07/04/16   PT Start Time 0845   PT Stop Time 0939   PT Time Calculation (min) 54 min   Activity Tolerance Patient tolerated treatment well      History reviewed. No pertinent past medical history.  Past Surgical History:  Procedure Laterality Date  . BACK SURGERY    . TUBAL LIGATION      There were no vitals filed for this visit.      Subjective Assessment - 06/11/16 0852    Subjective Patient reports osme change in the back pain she has been experiencing.    Currently in Pain? Yes   Pain Score 2    Pain Location Back   Pain Orientation Right;Left;Lower   Pain Descriptors / Indicators Dull   Pain Type Chronic pain   Pain Onset More than a month ago   Pain Frequency Intermittent   Aggravating Factors  household chores    Pain Relieving Factors aleve; heating pad                          [facility] Adult PT Treatment/Exercise - 06/11/16 0001      Lumbar Exercises: Stretches   Passive Hamstring Stretch 2 reps;30 seconds   Lower Trunk Rotation 3 reps  5 sec      Lumbar Exercises: Supine   Ab Set 5 reps;5 seconds   AB Set Limitations tactile cues   Bridge 10 reps;3 seconds   Isometric Hip Flexion 10 reps;2 seconds   Other Supine Lumbar Exercises dead bug x 10 ea      Lumbar Exercises: Prone   Single Arm Raise Right;Left;5 reps   Straight Leg Raise 10 reps     Moist Heat Therapy   Number Minutes Moist Heat 15 Minutes   Moist Heat Location Lumbar Spine     Electrical Stimulation   Electrical Stimulation Location  bilat lumbar paraspinals    Electrical Stimulation Action IFC    Electrical Stimulation Parameters to tolerance    Electrical Stimulation Goals Tone;Pain     Manual Therapy   Manual Therapy Soft tissue mobilization   Manual therapy comments prone    Soft tissue mobilization bilat lumbar paraspoinals           Trigger Point Dry Needling - 06/11/16 1000    Consent Given? Yes   Education Handout Provided Yes   Muscles Treated Lower Body --  lumbar paraspinals - decreased palpable tightness    Gluteus Maximus Response Palpable increased muscle length   Gluteus Minimus Response Palpable increased muscle length   Piriformis Response Twitch response elicited;Palpable increased muscle length              PT Education - 06/11/16 0945    Education provided Yes   Education Details TDN   Person(s) Educated Patient   Methods Explanation   Comprehension Verbalized understanding             PT Long Term Goals - 06/11/16 0851      PT LONG TERM GOAL #1  Title Improve lumbar extension to 40-50% assessec in standing 07/04/16   Time 6   Period Weeks   Status On-going     PT LONG TERM GOAL #2   Title Improve core stability and strength allowing pt to perform exercise and walking program as well as increase tolerance for functional activities 07/04/16   Time 6   Period Weeks   Status On-going     PT LONG TERM GOAL #3   Title Increase activity level with patient working in a community based exercise program 2-3 times per week 07/04/16   Time 6   Period Weeks   Status On-going     PT LONG TERM GOAL #4   Title Independent in HEP 07/04/16   Time 6   Period Weeks   Status On-going     PT LONG TERM GOAL #5   Title Improve FOTO to </= 37% limitation 07/04/16   Time 6   Period Weeks   Status On-going               Plan - 06/11/16 1001    Clinical Impression Statement continued difficulty with core stabilization but tolerated exercises well. Trial of TDN produced  good response in tissue through the lumbar paraspinals and Lt piriformis/ hip abductors.  Patient was on moist heat and estim in supine following ex/TDN when she reported nausea and not feeling well. Estim was stopped and patient rested in supine until she felt better which took about 10 min with water and being propped more upright. Patient does report that she has a headache this am and took medication before she came in for PT. Nausea may be related to HA/meds/time in prone/TDN. Much better when she left therapy. Will moniter.    Rehab Potential Good   PT Frequency 2x / week   PT Duration 6 weeks   PT Treatment/Interventions Patient/family education;ADLs/Self Care Home Management;Neuromuscular re-education;Electrical Stimulation;Cryotherapy;Iontophoresis 4mg /ml Dexamethasone;Moist Heat;Ultrasound;Manual techniques;Dry needling;Therapeutic activities;Therapeutic exercise   PT Next Visit Plan  continue progressive core stabilization.  Modalities as indicated.    Consulted and Agree with Plan of Care Patient      Patient will benefit from skilled therapeutic intervention in order to improve the following deficits and impairments:  Postural dysfunction, Improper body mechanics, Pain, Decreased range of motion, Decreased strength, Decreased mobility, Increased fascial restricitons, Increased muscle spasms, Decreased activity tolerance  Visit Diagnosis: Bilateral low back pain without sciatica  Myalgia  Other symptoms and signs involving the musculoskeletal system     Problem List Patient Active Problem List   Diagnosis Date Noted  . Adnexal mass 04/23/2016  . 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 Chronic tonsillitis 11/02/2015  . 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 Hypothyroidism 04/01/2015  . Hyperlipidemia 04/01/2015  . Depression 04/01/2015  . Anemia 04/01/2015  . Compression fracture of L1 lumbar vertebra ([facility]) 04/01/2015    Aurelio Mccamy Nilda Simmer PT, MPH 06/11/2016,  10:09 AM  [facility] [place], [place], [address] Phone: [phone]   Fax:  [phone]  Name: Suzanne Patrick MRN: [id number] Date of Birth: [birth year]

## 2016-06-11 NOTE — Patient Instructions (Signed)

## 2016-06-12 ENCOUNTER — Telehealth: Payer: Self-pay | Admitting: *Deleted

## 2016-06-12 NOTE — Telephone Encounter (Signed)
Callback: Pt reports she is much improved. Back is still sore from the acupuncture but nausea and weakness is better.

## 2016-06-14 ENCOUNTER — Ambulatory Visit (INDEPENDENT_AMBULATORY_CARE_PROVIDER_SITE_OTHER): Payer: Medicare HMO | Admitting: Physical Therapy

## 2016-06-14 ENCOUNTER — Telehealth: Payer: Self-pay

## 2016-06-14 DIAGNOSIS — M791 Myalgia, unspecified site: Secondary | ICD-10-CM

## 2016-06-14 DIAGNOSIS — M545 Low back pain, unspecified: Secondary | ICD-10-CM

## 2016-06-14 DIAGNOSIS — R29898 Other symptoms and signs involving the musculoskeletal system: Secondary | ICD-10-CM | POA: Diagnosis not present

## 2016-06-14 NOTE — Therapy (Signed)
[facility] [place], [place], [address] Phone: [phone]   Fax:  [phone]  Physical Therapy Treatment  Patient Details  Name: Suzanne Patrick MRN: [id number] Date of Birth: July 08, 1946 Referring Provider: Dr. Ileene Rubens   Encounter Date: 06/14/2016      PT End of Session - 06/14/16 0907    Visit Number 5   Number of Visits 12   Date for PT Re-Evaluation 07/04/16   PT Start Time 0848   PT Stop Time 0945   PT Time Calculation (min) 57 min      No past medical history on file.  Past Surgical History:  Procedure Laterality Date  . BACK SURGERY    . TUBAL LIGATION      There were no vitals filed for this visit.      Subjective Assessment - 06/14/16 1328    Subjective Pt reports she is no longer nauseous.  Her buttocks is still sore from the TDN.  "I'd like to not do that again".  Pt states she is leaving for 10 day trip to [place] in 2 days.    Currently in Pain? Yes   Pain Score 2    Pain Location Buttocks   Pain Orientation Left   Aggravating Factors  ?   Pain Relieving Factors heat             [facility] PT Assessment - 06/14/16 0001      Assessment   Medical Diagnosis LBP    Referring Provider Dr. Ileene Rubens    Next MD Visit PRN          [facility] Adult PT Treatment/Exercise - 06/14/16 0001      Lumbar Exercises: Stretches   Passive Hamstring Stretch 2 reps;30 seconds  each leg   Lower Trunk Rotation 3 reps;20 seconds   ITB Stretch 2 reps;30 seconds   Piriformis Stretch 2 reps;30 seconds     Lumbar Exercises: Aerobic   Stationary Bike NuStep L5: 2 min, L4: 4 min      Lumbar Exercises: Supine   Bridge 10 reps  2 sets   Other Supine Lumbar Exercises thoracic extension      Lumbar Exercises: Sidelying   Clam 20 reps     Moist Heat Therapy   Number Minutes Moist Heat 15 Minutes   Moist Heat Location Lumbar Spine;Knee     Electrical Stimulation   Electrical Stimulation Location  Rt lateral hamstring / Lt glute    Electrical Stimulation Action premod to each area   Electrical Stimulation Parameters to tolerance    Electrical Stimulation Goals Tone;Pain     Manual Therapy   Manual Therapy Soft tissue mobilization   Soft tissue mobilization to Rt lateral hamstring and Lt glute/ deep rotators.                      PT Long Term Goals - 06/11/16 0851      PT LONG TERM GOAL #1   Title Improve lumbar extension to 40-50% assessec in standing 07/04/16   Time 6   Period Weeks   Status On-going     PT LONG TERM GOAL #2   Title Improve core stability and strength allowing pt to perform exercise and walking program as well as increase tolerance for functional activities 07/04/16   Time 6   Period Weeks   Status On-going     PT LONG TERM GOAL #3   Title Increase activity level with patient working  in a community based exercise program 2-3 times per week 07/04/16   Time 6   Period Weeks   Status On-going     PT LONG TERM GOAL #4   Title Independent in HEP 07/04/16   Time 6   Period Weeks   Status On-going     PT LONG TERM GOAL #5   Title Improve FOTO to </= 37% limitation 07/04/16   Time 6   Period Weeks   Status On-going               Plan - 06/14/16 0907    Clinical Impression Statement Pt had some cramping in her Rt hamstring with bridging. Noted palpable tightness in distal Rt lateral hamstring.  Pt tolerated all exercises well. and she reported decreased soreness at end of session.   Required some VC to stay on task during session.   Progressing towards goals.    Rehab Potential Good   PT Frequency 2x / week   PT Duration 6 weeks   PT Treatment/Interventions Patient/family education;ADLs/Self Care Home Management;Neuromuscular re-education;Electrical Stimulation;Cryotherapy;Iontophoresis 4mg /ml Dexamethasone;Moist Heat;Ultrasound;Manual techniques;Dry needling;Therapeutic activities;Therapeutic exercise   PT Next Visit Plan Pt will be  out of town for 2 wks; will continue core stabilization when she returns.    Consulted and Agree with Plan of Care Patient      Patient will benefit from skilled therapeutic intervention in order to improve the following deficits and impairments:  Postural dysfunction, Improper body mechanics, Pain, Decreased range of motion, Decreased strength, Decreased mobility, Increased fascial restricitons, Increased muscle spasms, Decreased activity tolerance  Visit Diagnosis: No diagnosis found.     Problem List Patient Active Problem List   Diagnosis Date Noted  . Adnexal mass 04/23/2016  . 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 Chronic tonsillitis 11/02/2015  . 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 Hypothyroidism 04/01/2015  . Hyperlipidemia 04/01/2015  . Depression 04/01/2015  . Anemia 04/01/2015  . Compression fracture of L1 lumbar vertebra [facility]) 04/01/2015   Kerin Perna, PTA 06/14/16 1:34 PM [place], [place], [address] Phone: [phone]   Fax:  [phone]  Name: Lorenza Propes MRN: [id number] Date of Birth: [birth year]

## 2016-06-15 MED ORDER — AMOXICILLIN-POT CLAVULANATE 500-125 MG PO TABS: ORAL_TABLET | ORAL | 0 refills | Status: AC

## 2016-06-15 NOTE — Telephone Encounter (Signed)
Pt.notified

## 2016-06-15 NOTE — Telephone Encounter (Signed)
Augmentin sent to [facility], f/u on Monday if no improvement

## 2016-07-03 ENCOUNTER — Encounter: Payer: Self-pay | Admitting: Physical Therapy

## 2016-07-03 ENCOUNTER — Encounter (INDEPENDENT_AMBULATORY_CARE_PROVIDER_SITE_OTHER): Payer: Self-pay

## 2016-07-03 ENCOUNTER — Ambulatory Visit (INDEPENDENT_AMBULATORY_CARE_PROVIDER_SITE_OTHER): Payer: Commercial Managed Care - HMO | Admitting: Physical Therapy

## 2016-07-03 ENCOUNTER — Other Ambulatory Visit: Payer: Self-pay | Admitting: Family Medicine

## 2016-07-03 DIAGNOSIS — M791 Myalgia, unspecified site: Secondary | ICD-10-CM

## 2016-07-03 DIAGNOSIS — R29898 Other symptoms and signs involving the musculoskeletal system: Secondary | ICD-10-CM | POA: Diagnosis not present

## 2016-07-03 DIAGNOSIS — M545 Low back pain, unspecified: Secondary | ICD-10-CM

## 2016-07-03 NOTE — Patient Instructions (Addendum)
  Abdominal Bracing With Pelvic Floor (Hook-Lying)   With neutral spine, tighten pelvic floor and abdominals. Hold 10 seconds. Repeat __10_ times. Do _1__ times a day.  FLEXION: Supine (Isometric)    Lie on back, feet flat. Draw left knee toward chest push gently with same side arm. Hold __5_ seconds. Complete _1__ sets of _10__ repetitions. Perform _1__ sessions per day. Perform with same hand and the opposite hand   Supine: Leg Stretch with Strap (Super Advanced)    Lie on back with one leg straight. Hook strap around other foot. Straighten knee. Raise leg to maximal stretch and straighten knee further by tightening quadriceps. Slowly press other leg down as close to floor as possible. Keep lower abdominals tight. Hold _30-45__ seconds. Warning: Intense stretch. Stay within tolerance. Repeat _1-2__ times per session. Do _1__ sessions per day. Repeat on the other leg.    Lower Trunk Rotation Stretch    Keeping back flat and feet together, rotate knees to left side. Hold _1___ seconds. Repeat __5-10__ times per set. Do __1__ sets per session. Do __1__ sessions per day.  Leg Lift: One-Leg    Press pelvis down. Keep knee straight; lengthen and lift one leg (from waist). Do not twist body. Keep other leg down. Hold _2__ seconds. Relax. Repeat 10-20 time. Repeat with other leg. Perform once a day.   Shoulder Blade Squeeze: Fingers Interlaced    Fingers interlaced behind your body, palms facing each other. Press pelvis down. Squeeze backbone with shoulder blades. Raise front of shoulders, chest, and head. Keep neck neutral. Hold _2__ seconds. Relax. Repeat _10-20__ times. Once a day.   Copyright  VHI. All rights reserved.

## 2016-07-03 NOTE — Therapy (Signed)
[facility] [place] [place], [place], [address] Phone: [phone]   Fax:  [phone]  Physical Therapy Treatment  Patient Details  Name: Suzanne Patrick MRN: [id number] Date of Birth: 12/31/1945 Referring Provider: Dr Ileene Rubens  Encounter Date: 07/03/2016      PT End of Session - 07/03/16 0851    Visit Number 6   Number of Visits 12   Date for PT Re-Evaluation 07/04/16   PT Start Time 2202   PT Stop Time 0947   PT Time Calculation (min) 53 min   Activity Tolerance Patient tolerated treatment well      History reviewed. No pertinent past medical history.  Past Surgical History:  Procedure Laterality Date  . BACK SURGERY    . TUBAL LIGATION      There were no vitals filed for this visit.      Subjective Assessment - 07/03/16 0856    Subjective Pt states she is doing a little better.  Still has pain with household chores.    Currently in Pain? No/denies  only with household chores, she is trying to use new ways to keep good body mechanics, forgets through            [facility] PT Assessment - 07/03/16 0001      Assessment   Medical Diagnosis LBP    Referring Provider Dr Ileene Rubens   Onset Date/Surgical Date 04/13/15   Hand Dominance Right   Next MD Visit PRN     Observation/Other Assessments   Focus on Therapeutic Outcomes (FOTO)  46% limited     ROM / Strength   AROM / PROM / Strength AROM;Strength     AROM   Lumbar Flexion 2" from floor, slight pain Rt buttock   Lumbar Extension WNL   Lumbar - Right Side Bend WNL   Lumbar - Left Side Bend WNL   Lumbar - Right Rotation WNL   Lumbar - Left Rotation WNL     Strength   Strength Assessment Site Hip;Lumbar   Right/Left Hip Right;Left   Right Hip Flexion 5/5   Right Hip ABduction --  5-/5   Left Hip Flexion 5/5   Left Hip ABduction --  5-/5   Lumbar Flexion --  fair (-) minimal contraction felt on either side                     [facility] Adult PT Treatment/Exercise - 07/03/16 0001      Lumbar Exercises: Stretches   Passive Hamstring Stretch 2 reps;30 seconds  with strap   Lower Trunk Rotation 5 reps     Lumbar Exercises: Aerobic   Stationary Bike nustep L5x5'     Lumbar Exercises: Supine   Isometric Hip Flexion 10 reps;5 seconds  each side and contralateral side     Lumbar Exercises: Prone   Straight Leg Raise 10 reps  with pelvic press   Other Prone Lumbar Exercises upper body lifts     Modalities   Modalities Electrical Stimulation;Moist Heat     Moist Heat Therapy   Number Minutes Moist Heat 15 Minutes   Moist Heat Location Lumbar Spine     Electrical Stimulation   Electrical Stimulation Location lumbar spine   Electrical Stimulation Action IFC   Electrical Stimulation Parameters to tolerance   Electrical Stimulation Goals Pain                PT Education - 07/03/16 0924    Education provided Yes  Education Details reviewed old HEP and added some new ones   Person(s) Educated Patient   Methods Explanation;Demonstration;Handout   Comprehension Returned demonstration;Verbalized understanding             PT Long Term Goals - 07/03/16 0858      PT LONG TERM GOAL #2   Title Improve core stability and strength allowing pt to perform exercise and walking program as well as increase tolerance for functional activities 07/04/16   Time 4   Period Weeks   Status On-going     PT LONG TERM GOAL #3   Title Increase activity level with patient working in a community based exercise program 2-3 times per week 07/04/16   Time 4   Period Weeks   Status On-going     PT LONG TERM GOAL #4   Title Independent in HEP 07/04/16   Time 4   Period Weeks   Status On-going     PT LONG TERM GOAL #5   Title Improve FOTO to </= 37% limitation 07/04/16   Time 4   Period Weeks   Status On-going  currently 46% limited               Plan - 07/03/16 0907    Clinical Impression  Statement Pt states she didnt get to perform her HEP much while [place] was [place] of [place] country and has been settling back into her home routine now.  She is going to restart her exercises.  She reports she is about 40-50% better.  Barabara requests to work on her HEP over the next couple of weeks to see if she will continue to improve. Goals are not met.    PT Next Visit Plan Pt requests to be placed on hold to see how she does with her HEP now that she is back home.  If she has increased pain she will call and return for formal treatment.       Patient will benefit from skilled therapeutic intervention in order to improve the following deficits and impairments:     Visit Diagnosis: Bilateral low back pain without sciatica  Myalgia  Other symptoms and signs involving the musculoskeletal system     Problem List Patient Active Problem List   Diagnosis Date Noted  . Adnexal mass 04/23/2016  . 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 Chronic tonsillitis 11/02/2015  . 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 Hypothyroidism 04/01/2015  . Hyperlipidemia 04/01/2015  . Depression 04/01/2015  . Anemia 04/01/2015  . Compression fracture of L1 lumbar vertebra (HCC) 04/01/2015    Jeral Pinch PT  07/03/2016, 9:34 AM  [facility] [place], [place], [address] Phone: [phone]   Fax:  [phone]  Name: Suzanne Patrick MRN: [id number] Date of Birth: 23-Dec-1945

## 2016-07-17 ENCOUNTER — Encounter: Payer: Self-pay | Admitting: Family Medicine

## 2016-07-17 ENCOUNTER — Telehealth: Payer: Self-pay

## 2016-07-17 ENCOUNTER — Ambulatory Visit (INDEPENDENT_AMBULATORY_CARE_PROVIDER_SITE_OTHER): Payer: Commercial Managed Care - HMO | Admitting: Family Medicine

## 2016-07-17 VITALS — BP 113/76 | HR 71 | Wt 136.0 lb

## 2016-07-17 DIAGNOSIS — R5383 Other fatigue: Secondary | ICD-10-CM | POA: Diagnosis not present

## 2016-07-17 DIAGNOSIS — M5441 Lumbago with sciatica, right side: Secondary | ICD-10-CM | POA: Diagnosis not present

## 2016-07-17 DIAGNOSIS — Z23 Encounter for immunization: Secondary | ICD-10-CM | POA: Diagnosis not present

## 2016-07-17 DIAGNOSIS — M5442 Lumbago with sciatica, left side: Secondary | ICD-10-CM

## 2016-07-17 DIAGNOSIS — E039 Hypothyroidism, unspecified: Secondary | ICD-10-CM

## 2016-07-17 MED ORDER — LIOTHYRONINE SODIUM 25 MCG PO TABS: 25.0000 ug | ORAL_TABLET | Freq: Every day | ORAL | 1 refills | Status: DC

## 2016-07-17 NOTE — Telephone Encounter (Signed)
The foot pads would be the metatarsal pads that we have in stock.  This will only help if she's having pain while wearing shoes or sandals and will not help when she is barefoot.  During her visit she said she was not having pain with wearing shoes or sandals, she was only experiencing pain when walking barefoot.  Therefore if this is still true she will not benefit from foot pads.

## 2016-07-17 NOTE — Telephone Encounter (Signed)
Pt.notified

## 2016-07-17 NOTE — Progress Notes (Signed)
CC: Suzanne Patrick is a 70 y.o. female is here for Fatigue   Subjective: HPI:  Follow fatigue: She tells me that she's felt fatigued for the entire year 2017. She's tried over-the-counter vitamin supplements but nothing seemed to help. She denies nonrestorative sleep. She tells me she is no longer able to clean her house in 1 session due to the fatigue. She denies any weakness or shortness of breath. She continues to take her thyroid supplement with 100% compliance and labs earlier in the summer were unremarkable. She denies any unintentional weight loss or appetite change.  Follow-up back pain: She believes that physical therapy was helping greatly with the pain and she still has a small percentage of the pain left. She would like to keep seeing physical therapy if possible.    Review Of Systems Outlined In HPI  No past medical history on file.  Past Surgical History:  Procedure Laterality Date  . BACK SURGERY    . TUBAL LIGATION     Family History  Problem Relation Age of Onset  . Heart attack Father   . Hyperlipidemia Mother     Social History   Social History  . Marital status: Unknown    Spouse name: N/A  . Number of children: N/A  . Years of education: N/A   Occupational History  . Not on file.   Social History Main Topics  . Smoking status: Never Smoker  . Smokeless tobacco: Never Used  . Alcohol use 1.8 oz/week    3 Standard drinks or equivalent per week  . Drug use: No  . Sexual activity: Yes    Partners: Male   Other Topics Concern  . Not on file   Social History Narrative  . No narrative on file     Objective: BP 113/76   Pulse 71   Wt 136 lb (61.7 kg)   BMI 22.63 kg/m   Vital signs reviewed. General: Alert and Oriented, No Acute Distress HEENT: Pupils equal, round, reactive to light. Conjunctivae clear.  External ears unremarkable.  Moist mucous membranes. Lungs: Clear and comfortable work of breathing, speaking in full sentences without  accessory muscle use. Cardiac: Regular rate and rhythm.  Neuro: CN II-XII grossly intact, gait normal. Extremities: No peripheral edema.  Strong peripheral pulses.  Mental Status: No depression, anxiety, nor agitation. Logical though process. Skin: Warm and dry.  Assessment & Plan: Kimila was seen today for fatigue.  Diagnoses and all orders for this visit:  Other fatigue  Hypothyroidism, unspecified hypothyroidism type  Bilateral low back pain with sciatica, sciatica laterality unspecified -     Ambulatory referral to Physical Therapy  Needs flu shot -     Flu vaccine HIGH DOSE PF (Fluzone High dose)  Need for prophylactic vaccination against Streptococcus pneumoniae (pneumococcus) -     Pneumococcal polysaccharide vaccine 23-valent greater than or equal to 2yo subcutaneous/IM  Need for Tdap vaccination -     Tdap vaccine greater than or equal to 7yo IM  Other orders -     liothyronine (CYTOMEL) 25 MCG tablet; Take 1 tablet (25 mcg total) by mouth daily.   Fatigue: Discuss lab was unable to find any source of her fatigue however she can try Cytomel in addition to levothyroxine to see if this helps with some of her fatigue. If there is no benefit after a month I recommended that she stop this medication. Signs and symptoms requring emergent/urgent reevaluation were discussed with the patient.  25 minutes spent face-to-face  during visit today of which at least 50% was counseling or coordinating care regarding: 1. Other fatigue   2. Hypothyroidism, unspecified hypothyroidism type   3. Bilateral low back pain with sciatica, sciatica laterality unspecified   4. Needs flu shot   5. Need for prophylactic vaccination against Streptococcus pneumoniae (pneumococcus)   6. Need for Tdap vaccination       No Follow-up on file.

## 2016-07-25 ENCOUNTER — Ambulatory Visit (INDEPENDENT_AMBULATORY_CARE_PROVIDER_SITE_OTHER): Payer: Commercial Managed Care - HMO | Admitting: Rehabilitative and Restorative Service Providers"

## 2016-07-25 DIAGNOSIS — R29898 Other symptoms and signs involving the musculoskeletal system: Secondary | ICD-10-CM

## 2016-07-25 DIAGNOSIS — M545 Low back pain, unspecified: Secondary | ICD-10-CM

## 2016-07-25 DIAGNOSIS — M791 Myalgia, unspecified site: Secondary | ICD-10-CM

## 2016-07-25 NOTE — Therapy (Signed)
[facility] [place], [place], [address] Phone: [phone]   Fax:  [phone]  Physical Therapy Treatment  Patient Details  Name: Suzanne Patrick MRN: [id number] Date of Birth: Apr 25, 1946 Referring Provider: Dr. Beatrice Lecher  Encounter Date: 07/25/2016      PT End of Session - 07/25/16 1106    Visit Number 1   Number of Visits 12   Date for PT Re-Evaluation 09/05/16   PT Start Time 1105   PT Stop Time 1204   PT Time Calculation (min) 59 min   Activity Tolerance Patient tolerated treatment well      No past medical history on file.  Past Surgical History:  Procedure Laterality Date  . BACK SURGERY    . TUBAL LIGATION      There were no vitals filed for this visit.      Subjective Assessment - 07/25/16 1109    Subjective On a trip to [place] - home ~2 weeks. She continues to have LBP on both sides of low back. She has had some intermittent pain for the past year - since she fell sustaining fx of lumbar vertebrae. Pain is worse with functional activities and after she has been sitting and gets up again.    Pertinent History mass growing on ovary  Hx of DDD; spinal stenosis; HNP lumbar spine; cervical disc surgery years ago (pt is a poor historian)    How long can you sit comfortably? 1-2 hour    How long can you stand comfortably? 1 hour    How long can you walk comfortably? 1 hour    Diagnostic tests xrays; MRI    Patient Stated Goals get rid of pain and make back strong again - increase strength    Currently in Pain? Yes   Pain Score 2    Pain Location Back   Pain Orientation Left;Right;Lower   Pain Descriptors / Indicators Dull;Aching   Pain Type Chronic pain   Pain Radiating Towards no radiating pain    Pain Onset More than a month ago   Pain Frequency Intermittent   Aggravating Factors  bent forward activities; standing after having been sitting for 1-2 hours   Pain Relieving  Factors heat; Suzanne Patrick            [facility] PT Assessment - 07/25/16 0001      Assessment   Medical Diagnosis LBP    Referring Provider Dr. Beatrice Lecher   Onset Date/Surgical Date 04/13/15   Hand Dominance Right   Next MD Visit PRN     Precautions   Precautions None     Balance Screen   Has the patient fallen in the past 6 months No   Has the patient had a decrease in activity level because of a fear of falling?  No   Is the patient reluctant to leave their home because of a fear of falling?  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    Leisure household chores; Field seismologist; walking 1 x every 2 weeks around block; otherwise sedentary      Observation/Other Assessments   Focus on Therapeutic Outcomes (FOTO)  48% limitation      Sensation   Additional Comments WFL's per pt report      AROM   Lumbar Flexion 80%  pain in LB   Lumbar  Extension 55%  pain in LB   Lumbar - Right Side Bend 80%   Lumbar - Left Side Bend 70%  pain in Lt LB   Lumbar - Right Rotation 40%   Lumbar - Left Rotation 45%  pain in the LB     Strength   Right/Left Hip --  WNL's except as noted    Right Hip ABduction --  5-/5   Left Hip ABduction --  5-/5     Flexibility   Hamstrings 85 deg bilat    Quadriceps tight Rt > Lt   ITB WNL's   Piriformis WNL's     Palpation   Spinal mobility minimal tightness and tenderness with CPA and UPA mobs lumbar spine    Palpation comment muscular tightness through bilat lumbar paraspinals and QL Rt > Lt                      [facility] Adult PT Treatment/Exercise - 07/25/16 0001      Lumbar Exercises: Stretches   Passive Hamstring Stretch 1 rep;30 seconds   Quad Stretch 1 rep;30 seconds   ITB Stretch 1 rep;30 seconds   Piriformis Stretch 1 rep;30 seconds     Lumbar Exercises: Supine   Ab Set 5 reps;5 seconds     Moist  Heat Therapy   Number Minutes Moist Heat 20 Minutes   Moist Heat Location Lumbar Spine     Ultrasound   Ultrasound Location bilat lumbar paraspinals and QL's   Ultrasound Parameters 100%; 1.5 w/cm2; 1 mHz; 8 min    Ultrasound Goals Pain;Other (Comment)  muscular tightness     Manual Therapy   Manual Therapy Soft tissue mobilization   Manual therapy comments prone    Soft tissue mobilization bilat lumbar paraspinals; QL Rt > Lt                 PT Education - 07/25/16 1214    Education provided Yes   Education Details review of HEP    Person(s) Educated Patient   Methods Explanation;Tactile cues;Verbal cues   Comprehension Verbalized understanding;Returned demonstration;Verbal cues required;Tactile cues required             PT Long Term Goals - 07/25/16 1223      PT LONG TERM GOAL #1   Title Improve lumbar extension to 50% assessed in standing 09/05/16   Time 6   Period Weeks   Status Revised     PT LONG TERM GOAL #2   Title Improve core stability and strength allowing pt to perform exercise and walking program as well as increase tolerance for functional activities 09/05/16   Time 6   Period Weeks   Status Revised     PT LONG TERM GOAL #3   Title Increase activity level with patient working in a community based exercise program 2-3 times per week 09/05/16   Time 6   Period Weeks   Status Revised     PT LONG TERM GOAL #4   Title Independent in HEP 09/05/16   Time 6   Period Weeks   Status Revised     PT LONG TERM GOAL #5   Title Improve FOTO to </= 37% limitation 09/05/16   Time 6   Period Weeks   Status Revised               Plan - 07/25/16 1215    Clinical Impression Statement Suzanne Patrick reports that she has had difficulty being consistent with HEP  due to travel. She will resume her HEP consistently now that she is back home. Suzanne Patrick has persisstent tightness and limited moblity through the lumbar spine; muscular tightness through Rt > Lt  lumbar paraspinals and QL's; limited functional activity level and pain with functional activities and movement. She will benefit from consistent therapy to progress toward return to maximum therapeutic benefit and level of function.   Rehab Potential Good   PT Frequency 2x / week   PT Duration 6 weeks   PT Treatment/Interventions Patient/family education;ADLs/Self Care Home Management;Neuromuscular re-education;Electrical Stimulation;Cryotherapy;Iontophoresis 4mg /ml Dexamethasone;Moist Heat;Ultrasound;Manual techniques;Dry needling;Therapeutic activities;Therapeutic exercise   PT Next Visit Plan Focus on modalities and manual work to address muscular tightness through the lumbar paraspinals and QL's. Continue with ther ex    Consulted and Agree with Plan of Care Patient      Patient will benefit from skilled therapeutic intervention in order to improve the following deficits and impairments:  Postural dysfunction, Improper body mechanics, Pain, Decreased range of motion, Decreased strength, Decreased mobility, Increased fascial restricitons, Increased muscle spasms, Decreased activity tolerance  Visit Diagnosis: Bilateral low back pain without sciatica - Plan: PT plan of care cert/re-cert  Myalgia - Plan: PT plan of care cert/re-cert  Other symptoms and signs involving the musculoskeletal system - Plan: PT plan of care cert/re-cert     Problem List Patient Active Problem List   Diagnosis Date Noted  . Adnexal mass 04/23/2016  . 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 Chronic tonsillitis 11/02/2015  . 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 Hypothyroidism 04/01/2015  . Hyperlipidemia 04/01/2015  . Depression 04/01/2015  . Anemia 04/01/2015  . Compression fracture of L1 lumbar vertebra (HCC) 04/01/2015    Jalyssa Fleisher Nilda Simmer PT, MPH  07/25/2016, 12:28 PM  [facility] [place], [place], [address] Phone:  [phone]   Fax:  [phone]  Name: Suzanne Patrick MRN: [id number] Date of Birth: 1946/01/19

## 2016-07-31 ENCOUNTER — Ambulatory Visit (INDEPENDENT_AMBULATORY_CARE_PROVIDER_SITE_OTHER): Payer: Commercial Managed Care - HMO | Admitting: Physical Therapy

## 2016-07-31 DIAGNOSIS — M545 Low back pain, unspecified: Secondary | ICD-10-CM

## 2016-07-31 DIAGNOSIS — M791 Myalgia, unspecified site: Secondary | ICD-10-CM

## 2016-07-31 DIAGNOSIS — R29898 Other symptoms and signs involving the musculoskeletal system: Secondary | ICD-10-CM

## 2016-07-31 NOTE — Therapy (Signed)
[place], [place], [address] Phone: [phone]   Fax:  [phone]  Physical Therapy Treatment  Patient Details  Name: Suzanne Patrick MRN: [id number] Date of Birth: [birth year] Referring Provider: Dr. Madilyn Fireman  Encounter Date: 07/31/2016      PT End of Session - 07/31/16 1125    Visit Number 2   Number of Visits 12   Date for PT Re-Evaluation 09/05/16   PT Start Time 1115  pt arrived late   PT Stop Time 1204   PT Time Calculation (min) 49 min   Activity Tolerance Patient tolerated treatment well      No past medical history on file.  Past Surgical History:  Procedure Laterality Date  . BACK SURGERY    . TUBAL LIGATION      There were no vitals filed for this visit.      Subjective Assessment - 07/31/16 1129    Subjective Pt reports she has been performing the HEP 1x/ day.  She is breaking chores up to help decrease stress on back.    Currently in Pain? Yes   Pain Score 1    Pain Location Back   Pain Descriptors / Indicators Aching;Dull   Aggravating Factors  bending forward activities.    Pain Relieving Factors heat, Aleve.             [facility] PT Assessment - 07/31/16 0001      Assessment   Medical Diagnosis LBP   Referring Provider Dr. Madilyn Fireman   Onset Date/Surgical Date 04/13/15   Hand Dominance Right   Next MD Visit PRN          [facility] Adult PT Treatment/Exercise - 07/31/16 0001      Lumbar Exercises: Stretches   Passive Hamstring Stretch 3 reps;30 seconds   Lower Trunk Rotation 5 reps   Piriformis Stretch 3 reps;30 seconds   Piriformis Stretch Limitations multiple cues for correct form      Lumbar Exercises: Aerobic   Stationary Bike NuStep L 4: 4 min      Shoulder Exercises: Prone   Other Prone Exercises Axial ext with shoulder ext x 3 sec x 10 reps - VC for improved form.      Moist Heat Therapy   Number Minutes Moist Heat 15 Minutes   Moist Heat  Location Lumbar Spine     Electrical Stimulation   Electrical Stimulation Location lumbar paraspinals   Electrical Stimulation Action IFC   Electrical Stimulation Parameters to tolerance    Electrical Stimulation Goals Pain     Ultrasound   Ultrasound Location bilat lumbar paraspinals   Ultrasound Parameters 100%, 1.4 w/cm2, 8 mi n   Ultrasound Goals Pain;Other (Comment)  muscular tightness                     PT Long Term Goals - 07/25/16 1223      PT LONG TERM GOAL #1   Title Improve lumbar extension to 50% assessed in standing 09/05/16   Time 6   Period Weeks   Status Revised     PT LONG TERM GOAL #2   Title Improve core stability and strength allowing pt to perform exercise and walking program as well as increase tolerance for functional activities 09/05/16   Time 6   Period Weeks   Status Revised     PT LONG TERM GOAL #3   Title Increase activity level with patient working in a community based exercise  program 2-3 times per week 09/05/16   Time 6   Period Weeks   Status Revised     PT LONG TERM GOAL #4   Title Independent in HEP 09/05/16   Time 6   Period Weeks   Status Revised     PT LONG TERM GOAL #5   Title Improve FOTO to </= 37% limitation 09/05/16   Time 6   Period Weeks   Status Revised               Plan - 07/31/16 1150    Clinical Impression Statement Pt required some VC to correct how she is performing exercises from HEP.  Pt reported decrease in low back pain at end of session.   Session length limited due to pt's late arrival.   No goals met yet; only 2nd visit.    Rehab Potential Good   PT Frequency 2x / week   PT Duration 6 weeks   PT Treatment/Interventions Patient/family education;ADLs/Self Care Home Management;Neuromuscular re-education;Electrical Stimulation;Cryotherapy;Iontophoresis 79m/ml Dexamethasone;Moist Heat;Ultrasound;Manual techniques;Dry needling;Therapeutic activities;Therapeutic exercise   PT Next Visit  Plan Focus on modalities and manual work to address muscular tightness through the lumbar paraspinals and QL's. Continue with ther ex    Consulted and Agree with Plan of Care Patient      Patient will benefit from skilled therapeutic intervention in order to improve the following deficits and impairments:  Postural dysfunction, Improper body mechanics, Pain, Decreased range of motion, Decreased strength, Decreased mobility, Increased fascial restricitons, Increased muscle spasms, Decreased activity tolerance  Visit Diagnosis: Bilateral low back pain without sciatica  Myalgia  Other symptoms and signs involving the musculoskeletal system     Problem List Patient Active Problem List   Diagnosis Date Noted  . Adnexal mass 04/23/2016  . 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 Chronic tonsillitis 11/02/2015  . 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 Hypothyroidism 04/01/2015  . Hyperlipidemia 04/01/2015  . Depression 04/01/2015  . Anemia 04/01/2015  . Compression fracture of L1 lumbar vertebra ([facility] 04/01/2015   JKerin Perna PTA 07/31/16 12:32 PM   [place] [address]Phone: [phone]  Fax:  [phone] Name: Suzanne BleeckerMRN: [id number] of Birth: 112-17-1947

## 2016-08-07 ENCOUNTER — Encounter: Payer: Self-pay | Admitting: Rehabilitative and Restorative Service Providers"

## 2016-08-07 ENCOUNTER — Ambulatory Visit (INDEPENDENT_AMBULATORY_CARE_PROVIDER_SITE_OTHER): Payer: Commercial Managed Care - HMO | Admitting: Rehabilitative and Restorative Service Providers"

## 2016-08-07 DIAGNOSIS — M791 Myalgia, unspecified site: Secondary | ICD-10-CM

## 2016-08-07 DIAGNOSIS — R29898 Other symptoms and signs involving the musculoskeletal system: Secondary | ICD-10-CM

## 2016-08-07 DIAGNOSIS — M545 Low back pain, unspecified: Secondary | ICD-10-CM

## 2016-08-07 NOTE — Therapy (Signed)
Suzanne Patrick, [place], [address] Phone: [phone]   Fax:  [phone]  Physical Therapy Treatment  Patient Details  Name: Suzanne Patrick MRN: [id number] Date of Birth: [birth year] Referring Provider: Dr Suzanne Patrick  Encounter Date: 08/07/2016      PT End of Session - 08/07/16 1108    Visit Number 9   Number of Visits 12   Date for PT Re-Evaluation 09/05/16   PT Start Time 1103   PT Stop Time 1153   PT Time Calculation (min) 50 min   Activity Tolerance Patient tolerated treatment well      History reviewed. No pertinent past medical history.  Past Surgical History:  Procedure Laterality Date  . BACK SURGERY    . TUBAL LIGATION      There were no vitals filed for this visit.      Subjective Assessment - 08/07/16 1109    Subjective Doing well. Back is feeling better. She is doing her exercises at home. Not always every day, but doing them. This will be her last visit. Feels confident in continuing HEP independently.    Currently in Pain? No/denies            [facility] PT Assessment - 08/07/16 0001      Assessment   Medical Diagnosis LBP   Referring Provider Dr Suzanne Patrick   Onset Date/Surgical Date 04/13/15   Hand Dominance Right   Next MD Visit PRN     Observation/Other Assessments   Focus on Therapeutic Outcomes (FOTO)  46%     AROM   Lumbar Flexion 85%   Lumbar Extension 60%   Lumbar - Right Side Bend 80%   Lumbar - Left Side Bend 75%   Lumbar - Right Rotation 45%   Lumbar - Left Rotation 45%     Strength   Right/Left Hip --  WFL's bilat hips      Flexibility   Hamstrings 90 deg bilat    Quadriceps mild tightness Rt > Lt   ITB WNL's   Piriformis WNL's     Palpation   Spinal mobility minimal tightness and tenderness with CPA and UPA mobs lumbar spine    Palpation comment minimal muscular tightness through bilat lumbar paraspinals and QL Rt > Lt                       [facility] Adult PT Treatment/Exercise - 08/07/16 0001      Lumbar Exercises: Stretches   Passive Hamstring Stretch 3 reps;30 seconds   Lower Trunk Rotation 5 reps   Lower Trunk Rotation Limitations alternate knee drop for hip rotation 20 reps    Piriformis Stretch 3 reps;30 seconds     Lumbar Exercises: Aerobic   Stationary Bike NuStep L 4: 4 min      Lumbar Exercises: Supine   Ab Set 5 reps;5 seconds   Other Supine Lumbar Exercises hip adductor stretch supine 20 sec x 2      Shoulder Exercises: Prone   Other Prone Exercises Axial ext with shoulder ext x 3 sec x 10 reps - VC for improved form.      Moist Heat Therapy   Number Minutes Moist Heat 15 Minutes   Moist Heat Location Lumbar Spine     Electrical Stimulation   Electrical Stimulation Location lumbar paraspinals   Electrical Stimulation Action IFC   Electrical Stimulation Parameters to tolerance   Electrical Stimulation Goals Pain     Manual  Therapy   Manual Therapy Soft tissue mobilization   Manual therapy comments prone    Soft tissue mobilization bilat lumbar paraspinals; QL Rt > Lt                 PT Education - 08/07/16 1122    Education provided Yes   Education Details HEP   Person(s) Educated Patient   Methods Explanation;Demonstration;Tactile cues;Verbal cues;Handout   Comprehension Verbalized understanding;Returned demonstration;Verbal cues required;Tactile cues required             PT Long Term Goals - 08/07/16 1113      PT LONG TERM GOAL #1   Title Improve lumbar extension to 50% assessed in standing 09/05/16   Time 6   Period Weeks   Status Partially Met     PT LONG TERM GOAL #2   Title Improve core stability and strength allowing pt to perform exercise and walking program as well as increase tolerance for functional activities 09/05/16   Time 6   Period Weeks   Status Achieved     PT LONG TERM GOAL #3   Title Increase activity level with patient  working in a community based exercise program 2-3 times per week 09/05/16   Time 6   Period Weeks   Status Not Met     PT LONG TERM GOAL #4   Title Independent in HEP 09/05/16   Time 6   Period Weeks   Status Achieved     PT LONG TERM GOAL #5   Title Improve FOTO to </= 37% limitation 09/05/16   Time 6   Period Weeks   Status Partially Met               Plan - 08/07/16 1308    Clinical Impression Statement Good progress with rehab. Goals partially accomplished and patient is independent in HEP; progressing with functional activities. She is confident in continuing with Independent HEP. Will d/c to Independnet HEP. Patient will call with any questions or problems.    Rehab Potential Good   PT Frequency 2x / week   PT Duration 6 weeks   PT Treatment/Interventions Patient/family education;ADLs/Self Care Home Management;Neuromuscular re-education;Electrical Stimulation;Cryotherapy;Iontophoresis 23m/ml Dexamethasone;Moist Heat;Ultrasound;Manual techniques;Dry needling;Therapeutic activities;Therapeutic exercise   PT Next Visit Plan D/C to I HEP    Consulted and Agree with Plan of Care Patient      Patient will benefit from skilled therapeutic intervention in order to improve the following deficits and impairments:  Postural dysfunction, Improper body mechanics, Pain, Decreased range of motion, Decreased strength, Decreased mobility, Increased fascial restricitons, Increased muscle spasms, Decreased activity tolerance  Visit Diagnosis: Bilateral low back pain without sciatica  Myalgia  Other symptoms and signs involving the musculoskeletal system     Problem List Patient Active Problem List   Diagnosis Date Noted  . Adnexal mass 04/23/2016  . 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 Chronic tonsillitis 11/02/2015  . 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 Hypothyroidism 04/01/2015  . Hyperlipidemia 04/01/2015  . Depression 04/01/2015  . Anemia  04/01/2015  . Compression fracture of L1 lumbar vertebra (HCC) 04/01/2015    Suzanne Patrick PNilda SimmerPT, MPH  08/07/2016, 1:10 PM  [facility] [address]Phone: [phone]  Fax:  [phone] Name: Suzanne TodaroMRN: [id number] of Birth: 109-Apr-1947 PHYSICAL THERAPY DISCHARGE SUMMARY  Visits from Start of Care: 9  Current functional level related to goals /  functional outcomes: Independent in HEP - see progress note for d/c status   Remaining deficits: Intermittent pain in the LB    Education / Equipment: HEP  Plan: Patient agrees to discharge.  Patient goals were partially met. Patient is being discharged due to being pleased with the current functional level.  ?????    Suzanne Patrick Suzanne Patrick PT, MPH 08/07/16 1:11 PM

## 2016-08-07 NOTE — Patient Instructions (Addendum)
Stretching: Inner Thigh / Groin   Do this when lying down  Place heels together and pull feet toward groin until stretch is felt in groin and inner thigh. Hold __30-60__ seconds. Repeat __2-3__ times per set.  Do _1___ sessions per day.    Lying on back feet apart knees bent drop one knee in and then the other 10-20 times

## 2016-08-15 ENCOUNTER — Ambulatory Visit: Payer: Medicare HMO | Admitting: Physician Assistant

## 2016-08-22 ENCOUNTER — Ambulatory Visit (INDEPENDENT_AMBULATORY_CARE_PROVIDER_SITE_OTHER): Payer: Commercial Managed Care - HMO | Admitting: Osteopathic Medicine

## 2016-08-22 ENCOUNTER — Encounter: Payer: Self-pay | Admitting: Osteopathic Medicine

## 2016-08-22 VITALS — BP 138/73 | HR 90 | Ht 65.0 in | Wt 134.0 lb

## 2016-08-22 DIAGNOSIS — D235 Other benign neoplasm of skin of trunk: Secondary | ICD-10-CM

## 2016-08-22 DIAGNOSIS — L989 Disorder of the skin and subcutaneous tissue, unspecified: Secondary | ICD-10-CM

## 2016-08-22 DIAGNOSIS — D225 Melanocytic nevi of trunk: Secondary | ICD-10-CM | POA: Diagnosis not present

## 2016-08-22 NOTE — Progress Notes (Signed)
HPI: Suzanne Patrick is a 70 y.o. female  who presents to [place] today, 08/22/16,  for chief complaint of:  Chief Complaint  Patient presents with  . Rash     . Location:Just to the right of rectum . Quality: Small bump, Itching . Duration: 2 days or so . Timing: Constant . Modifying factors: No home remedies or OTC medications attempted . Assoc signs/symptoms: No widespread burning/itching, some problems with intermittent constipation    Past medical, surgical, social and family history reviewed:   Patient Active Problem List   Diagnosis Date Noted  . Adnexal mass 04/23/2016  . 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 Chronic tonsillitis 11/02/2015  . 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 Hypothyroidism 04/01/2015  . Hyperlipidemia 04/01/2015  . Depression 04/01/2015  . Anemia 04/01/2015  . Compression fracture of L1 lumbar vertebra ([place]) 04/01/2015    Past Surgical History:  Procedure Laterality Date  . BACK SURGERY    . TUBAL LIGATION     Social History  Substance Use Topics  . Smoking status: Never Smoker  . Smokeless tobacco: Never Used  . Alcohol use 1.8 oz/week    3 Standard drinks or equivalent per week   Family History  Problem Relation Age of Onset  . Heart attack Father   . Hyperlipidemia Mother      Current medication list and allergy/intolerance information reviewed:   Current Outpatient Prescriptions  Medication Sig Dispense Refill  . aspirin 81 MG EC tablet Take 81 mg by mouth.    Marland Kitchen atorvastatin (LIPITOR) 40 MG tablet Take 1 tablet (40 mg total) by mouth daily. 90 tablet 1  . citalopram (CELEXA) 20 MG tablet Take 1 tablet (20 mg total) by mouth daily. To help with mood. 90 tablet 2  . donepezil (ARICEPT) 5 MG tablet Take 1 tablet (5 mg total) by mouth at bedtime. For Memory 90 tablet 2  . estrogens, conjugated, (PREMARIN) 0.625 MG tablet Take 1 tablet (0.625 mg total) by mouth daily. Take  along with progesterone pill. 30 tablet 2  . levothyroxine (SYNTHROID, LEVOTHROID) 75 MCG tablet Take 1 tablet (75 mcg total) by mouth daily before breakfast. 90 tablet 1  . lidocaine (XYLOCAINE) 2 % solution 32mL gargled in the mouth then swallowed every six hours only as needed for throat pain. 250 mL 11  . liothyronine (CYTOMEL) 25 MCG tablet Take 1 tablet (25 mcg total) by mouth daily. 90 tablet 1  . meloxicam (MOBIC) 15 MG tablet Take 1 tablet (15 mg total) by mouth daily as needed (sore throat). 30 tablet 0  . omeprazole (PRILOSEC) 20 MG capsule Take 1 capsule (20 mg total) by mouth daily. 90 capsule 1  . progesterone (PROMETRIUM) 100 MG capsule TAKE 1 CAPSULE DAILY WITH ESTROGEN 90 capsule 0   No current facility-administered medications for this visit.    Allergies  Allergen Reactions  . Bee Venom Anaphylaxis      Review of Systems:  Constitutional:  No  fever, no chills, No recent illnes  HEENT: No  headache, no vision change  Cardiac: No  chest pain  Respiratory:  No  shortness of breath.  Gastrointestinal: No  abdominal pain, No  nausea, No  vomiting,  No  blood in stool, No  diarrhea, No  constipation   Musculoskeletal: No new myalgia/arthralgia  Genitourinary: No  incontinence, No  abnormal genital bleeding, No abnormal genital discharge  Skin: +Rash as per HPI, No other wounds/concerning lesions  Exam:  BP 138/73   Pulse 90   Ht 5\' 5"  (1.651 m)   Wt 134 lb (60.8 kg)   BMI 22.30 kg/m   Constitutional: VS see above. General Appearance: alert, well-developed, well-nourished, NAD  Eyes: Normal lids and conjunctive, non-icteric sclera  Ears, Nose, Mouth, Throat: MMM  Neck: No masses, trachea midline.   Respiratory: Normal respiratory effort.   Musculoskeletal: Gait normal.   Neurological: Normal balance/coordination. No tremor.   Skin: warm, dry, intact. Small pink colored warty growth to the right of the rectum, no significant subcutaneous mass, no  erythema, no induration. Patient reports some tenderness/itching to the lesion  Psychiatric: Normal judgment/insight. Normal mood and affect. Oriented x3.      ASSESSMENT/PLAN: Offered watchful waiting versus cryotherapy versus shave biopsy for further diagnosis. Lesion appears benign to me but given that it is irritating to the patient, she would like definitive diagnosis.  Benign skin growth - Appears to be benign warty growths, low suspicion for malignancy, shave biopsy for definitive treatment/diagnosis - Plan: Dermatology pathology    PRE-OP DIAGNOSIS: Abnormal Skin Lesion POST-OP DIAGNOSIS: Same  PROCEDURE: skin biopsy Performing Physician: Emeterio Reeve PROCEDURE: Shave Biopsy The area surrounding the skin lesion was prepared and draped in the usual sterile manner. The lesion was removed in the usual manner by shave biopsy. Hemostasis was assured. The patient tolerated the procedure well. Closure: None except silver nitrate for hemostasis Followup: The patient tolerated the procedure well without complications.  Standard post-procedure care is explained and return precautions are given.  All questions at time of visit were answered - patient instructed to contact office with any additional concerns. ER/RTC precautions were reviewed with the patient. Follow-up plan: Return if symptoms worsen or fail to improve, and to establish with a PCP.

## 2016-08-23 DIAGNOSIS — L989 Disorder of the skin and subcutaneous tissue, unspecified: Secondary | ICD-10-CM | POA: Insufficient documentation

## 2016-08-29 ENCOUNTER — Ambulatory Visit: Payer: Medicare HMO | Admitting: Family Medicine

## 2016-08-31 ENCOUNTER — Encounter: Payer: Self-pay | Admitting: Osteopathic Medicine

## 2016-08-31 ENCOUNTER — Ambulatory Visit (INDEPENDENT_AMBULATORY_CARE_PROVIDER_SITE_OTHER): Payer: Commercial Managed Care - HMO | Admitting: Osteopathic Medicine

## 2016-08-31 VITALS — BP 110/80 | HR 85 | Ht 65.0 in | Wt 132.0 lb

## 2016-08-31 DIAGNOSIS — E785 Hyperlipidemia, unspecified: Secondary | ICD-10-CM | POA: Diagnosis not present

## 2016-08-31 DIAGNOSIS — E039 Hypothyroidism, unspecified: Secondary | ICD-10-CM

## 2016-08-31 DIAGNOSIS — R198 Other specified symptoms and signs involving the digestive system and abdomen: Secondary | ICD-10-CM

## 2016-08-31 DIAGNOSIS — S32010S Wedge compression fracture of first lumbar vertebra, sequela: Secondary | ICD-10-CM | POA: Diagnosis not present

## 2016-08-31 DIAGNOSIS — F458 Other somatoform disorders: Secondary | ICD-10-CM

## 2016-08-31 DIAGNOSIS — N951 Menopausal and female climacteric states: Secondary | ICD-10-CM

## 2016-08-31 DIAGNOSIS — R0989 Other specified symptoms and signs involving the circulatory and respiratory systems: Secondary | ICD-10-CM

## 2016-08-31 DIAGNOSIS — R413 Other amnesia: Secondary | ICD-10-CM

## 2016-08-31 DIAGNOSIS — L989 Disorder of the skin and subcutaneous tissue, unspecified: Secondary | ICD-10-CM

## 2016-08-31 MED ORDER — DONEPEZIL HCL 10 MG PO TABS: 10.0000 mg | ORAL_TABLET | Freq: Every day | ORAL | 1 refills | Status: DC

## 2016-08-31 NOTE — Progress Notes (Signed)
HPI: Suzanne Patrick is a 70 y.o. female  who presents to [place] today, 08/31/16,  for chief complaint of:  Chief Complaint  Patient presents with  . Establish Care    Switching from [place]     Spot on bottom: sore, bled for about 3 days but stopped now. Pathology came back benign. All patient questions were answered.  Mucus feeling in throat: 6 months or more. Previously referred to ENT. Saw 2 different ENT providers, initially was told to drink water. Other provider told her to start taking Robitussin and this helps. Dr Lucia Gaskins ([facility], [facility]) looked in throat and said take the Robitussin. Patient does not recall having scope in the office. No imaging.   Memory problems: Patient has been on Aricept 5 mg. Diagnosis list includes memory problems but no official diagnosis of dementia. Patient asks if we can increase the Aricept. Reports forgetfulness, walking to room and forget why she went in there but generally will remember. Is not good with names but recognizes family members easily. Sometimes forgets what day it is. Is not exhibiting any dangerous behaviors such as leaving stove or iron on, for getting to lock the house, wandering, freeing to shut off car etc.  Hypothyroid: Patient has been doing well on liothyronine and levothyroxine.  Menopause: Patient is on hormone replacement therapy from her OB/GYN with estrogen and progesterone. Had hot flashes, has been on this medication for about a year with no drug holiday.  Osteoporosis: History of compression fracture after fall from standing height. Patient has not had bone density test performed.      Past medical, surgical, social and family history reviewed: No past medical history on file. Past Surgical History:  Procedure Laterality Date  . BACK SURGERY    . TUBAL LIGATION     Social History  Substance Use Topics  . Smoking status: Never Smoker  . Smokeless tobacco: Never Used  .  Alcohol use 1.8 oz/week    3 Standard drinks or equivalent per week   Family History  Problem Relation Age of Onset  . Heart attack Father   . Hyperlipidemia Mother      Current medication list and allergy/intolerance information reviewed:   Current Outpatient Prescriptions  Medication Sig Dispense Refill  . aspirin 81 MG EC tablet Take 81 mg by mouth.    Marland Kitchen atorvastatin (LIPITOR) 40 MG tablet Take 1 tablet (40 mg total) by mouth daily. 90 tablet 1  . citalopram (CELEXA) 20 MG tablet Take 1 tablet (20 mg total) by mouth daily. To help with mood. 90 tablet 2  . donepezil (ARICEPT) 5 MG tablet Take 1 tablet (5 mg total) by mouth at bedtime. For Memory 90 tablet 2  . estrogens, conjugated, (PREMARIN) 0.625 MG tablet Take 1 tablet (0.625 mg total) by mouth daily. Take along with progesterone pill. 30 tablet 2  . levothyroxine (SYNTHROID, LEVOTHROID) 75 MCG tablet Take 1 tablet (75 mcg total) by mouth daily before breakfast. 90 tablet 1  . lidocaine (XYLOCAINE) 2 % solution 65mL gargled in the mouth then swallowed every six hours only as needed for throat pain. 250 mL 11  . liothyronine (CYTOMEL) 25 MCG tablet Take 1 tablet (25 mcg total) by mouth daily. 90 tablet 1  . meloxicam (MOBIC) 15 MG tablet Take 1 tablet (15 mg total) by mouth daily as needed (sore throat). 30 tablet 0  . omeprazole (PRILOSEC) 20 MG capsule Take 1 capsule (20 mg total) by mouth daily.  90 capsule 1  . progesterone (PROMETRIUM) 100 MG capsule TAKE 1 CAPSULE DAILY WITH ESTROGEN 90 capsule 0   No current facility-administered medications for this visit.    Allergies  Allergen Reactions  . Bee Venom Anaphylaxis      Review of Systems:  Constitutional:  No  fever, no chills, No recent illness  HEENT: No  headache, no vision change, no hearing change, No sore throat, No  sinus pressure, +globus sensation and mucus production as per HPI  Cardiac: No  chest pain, No  pressure  Respiratory:  No  shortness of breath.  No  Cough  Gastrointestinal: No  abdominal pain, No  nausea, No  vomiting,  No  blood in stool, No  diarrhea, No  constipation   Musculoskeletal: No new myalgia/arthralgia  Skin: No  Rash   Hem/Onc: No  easy bruising/bleeding  Endocrine: No cold intolerance,  No heat intolerance. No polyuria/polydipsia/polyphagia   Neurologic: No  weakness, No  Dizziness. Memory problems, not progressive.  Psychiatric: No  concerns with depression, No  concerns with anxiety, No sleep problems, No mood problems  Exam:  BP 110/80   Pulse 85   Ht 5\' 5"  (1.651 m)   Wt 132 lb (59.9 kg)   BMI 21.97 kg/m   Constitutional: VS see above. General Appearance: alert, well-developed, well-nourished, NAD  Eyes: Normal lids and conjunctive, non-icteric sclera  Ears, Nose, Mouth, Throat: MMM, Normal external inspection ears/nares/mouth/lips/gums.  Neck: No masses, trachea midline. No thyroid enlargement.   Respiratory: Normal respiratory effort. no wheeze, no rhonchi, no rales  Cardiovascular: S1/S2 normal, no murmur, no rub/gallop auscultated. RRR. No lower extremity edema.  Musculoskeletal: Gait normal. No clubbing/cyanosis of digits.   Neurological: Normal balance/coordination. No tremor. No cranial nerve deficit on limited exam. Motor intact and symmetric. Cerebellar reflexes intact.   Skin: warm, dry, intact. No rash/ulcer.   Psychiatric: Normal judgment/insight. Normal mood and affect. Oriented x3.     ASSESSMENT/PLAN:   Globus sensation - Advised can try treatment of allergies/postnasal drip see if this improves anything, otherwise will need to follow-up with ENT, may require laryngoscope/imaging  Hypothyroidism, unspecified type - Recent labs reviewed, continue current doses  Closed compression fracture of first lumbar vertebra, sequela - Patient declines bone density test.  Hyperlipidemia, unspecified hyperlipidemia type  Menopause syndrome - Advised can take drug holiday, can stop  medications if she is no longer experiencing hot flash symptoms  Memory problem - Advised Aricept will not reverse memory problems, only slow decline. Okay to increase dose of current medication, consider further evaluation for dementia - Plan: donepezil (ARICEPT) 10 MG tablet  Benign skin growth    Patient Instructions  Try Flonase or similar generic for postnasal drip, if this isn't helping the throat symptoms, please call your ENT Dr. Lucia Gaskins    Visit summary with medication list and pertinent instructions was printed for patient to review. All questions at time of visit were answered - patient instructed to contact office with any additional concerns. ER/RTC precautions were reviewed with the patient. Follow-up plan: Return in about 6 months (around 03/01/2017) for LABS, THYROID, SOONER IF NEEDED.  Note: Total time spent 25 minutes, greater than 50% of the visit was spent face-to-face counseling and coordinating care for the following: The primary encounter diagnosis was Globus sensation. Diagnoses of Hypothyroidism, unspecified type, Closed compression fracture of first lumbar vertebra, sequela, Hyperlipidemia, unspecified hyperlipidemia type, Menopause syndrome, Memory problem, and Benign skin growth were also pertinent to this visit.Marland Kitchen

## 2016-08-31 NOTE — Patient Instructions (Signed)
Try Flonase or similar generic for postnasal drip, if this isn't helping the throat symptoms, please call your ENT Dr. Lucia Gaskins

## 2016-09-02 DIAGNOSIS — R198 Other specified symptoms and signs involving the digestive system and abdomen: Secondary | ICD-10-CM | POA: Insufficient documentation

## 2016-09-02 DIAGNOSIS — R0989 Other specified symptoms and signs involving the circulatory and respiratory systems: Secondary | ICD-10-CM | POA: Insufficient documentation

## 2016-09-11 ENCOUNTER — Ambulatory Visit: Payer: Commercial Managed Care - HMO | Admitting: Physician Assistant

## 2016-09-24 ENCOUNTER — Other Ambulatory Visit: Payer: Self-pay

## 2016-09-24 MED ORDER — PROGESTERONE MICRONIZED 100 MG PO CAPS: ORAL_CAPSULE | ORAL | 0 refills | Status: DC

## 2016-09-24 MED ORDER — LEVOTHYROXINE SODIUM 75 MCG PO TABS: 75.0000 ug | ORAL_TABLET | Freq: Every day | ORAL | 1 refills | Status: DC

## 2016-10-02 ENCOUNTER — Emergency Department (INDEPENDENT_AMBULATORY_CARE_PROVIDER_SITE_OTHER)
Admission: EM | Admit: 2016-10-02 | Discharge: 2016-10-02 | Disposition: A | Discharge status: Home / Self Care | Payer: Commercial Managed Care - HMO | Source: Home / Self Care | Attending: Family Medicine | Admitting: Family Medicine

## 2016-10-02 ENCOUNTER — Encounter: Payer: Self-pay | Admitting: *Deleted

## 2016-10-02 DIAGNOSIS — R251 Tremor, unspecified: Secondary | ICD-10-CM | POA: Diagnosis not present

## 2016-10-02 DIAGNOSIS — R259 Unspecified abnormal involuntary movements: Secondary | ICD-10-CM | POA: Diagnosis not present

## 2016-10-02 LAB — POCT CBC W AUTO DIFF (K'VILLE URGENT CARE)

## 2016-10-02 LAB — POCT FASTING CBG KUC MANUAL ENTRY: POCT Glucose (KUC): 127 mg/dL — AB (ref 70–99)

## 2016-10-02 NOTE — ED Provider Notes (Signed)
[facility]    CSN: [id number] Arrival date & time: 10/02/16  1504     History   Chief Complaint Chief Complaint  Patient presents with  . Tremors  . Nausea    HPI Suzanne Patrick is a 70 y.o. female.   Patient complains of acute onset of bilateral hand tremors, without other neurologic symptoms, about 1.5 hours ago.  She has also had sweats and chills today.  She states that she has had intermittent headache for 2 to 3 weeks, nausea without vomiting for about 2 weeks, and 2 week history of diarrhea occurring 2 to 3 times daily.  She also complains of intermittent vague left lower quadrant pain for about 6 months.  She admits that she has felt nervous and tense today.    The history is provided by the patient and the spouse.    History reviewed. No pertinent past medical history.  Patient Active Problem List   Diagnosis Date Noted  . Globus sensation 09/02/2016  . Benign skin growth 08/23/2016  . Adnexal mass 04/23/2016  . 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 Memory problem 12/06/2015  . Chronic tonsillitis 11/02/2015  . 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 Hypothyroidism 04/01/2015  . Hyperlipidemia 04/01/2015  . Depression 04/01/2015  . Anemia 04/01/2015  . Compression fracture of L1 lumbar vertebra ([place]) 04/01/2015    Past Surgical History:  Procedure Laterality Date  . BACK SURGERY    . TUBAL LIGATION      OB History    No data available       Home Medications    Prior to Admission medications   Medication Sig Start Date End Date Taking? Authorizing Provider  aspirin 81 MG EC tablet Take 81 mg by mouth.    Historical Provider, MD  atorvastatin (LIPITOR) 40 MG tablet Take 1 tablet (40 mg total) by mouth daily. 03/28/16   Sean Hommel, DO  citalopram (CELEXA) 20 MG tablet Take 1 tablet (20 mg total) by mouth daily. To help with mood. 03/28/16   Sean Hommel, DO  donepezil (ARICEPT) 10 MG tablet Take 1 tablet (10 mg total) by mouth at bedtime. For  Memory 08/31/16   Emeterio Reeve, DO  estrogens, conjugated, (PREMARIN) 0.625 MG tablet Take 1 tablet (0.625 mg total) by mouth daily. Take along with progesterone pill. 04/20/16   Marcial Pacas, DO  levothyroxine (SYNTHROID, LEVOTHROID) 75 MCG tablet Take 1 tablet (75 mcg total) by mouth daily before breakfast. 09/24/16   Emeterio Reeve, DO  lidocaine (XYLOCAINE) 2 % solution 45mL gargled in the mouth then swallowed every six hours only as needed for throat pain. 12/08/15   Marcial Pacas, DO  liothyronine (CYTOMEL) 25 MCG tablet Take 1 tablet (25 mcg total) by mouth daily. 07/17/16   Marcial Pacas, DO  meloxicam (MOBIC) 15 MG tablet Take 1 tablet (15 mg total) by mouth daily as needed (sore throat). 09/19/15   Sean Hommel, DO  omeprazole (PRILOSEC) 20 MG capsule Take 1 capsule (20 mg total) by mouth daily. 03/28/16   Marcial Pacas, DO  progesterone (PROMETRIUM) 100 MG capsule TAKE 1 CAPSULE DAILY WITH ESTROGEN 09/24/16   Emeterio Reeve, DO    Family History Family History  Problem Relation Age of Onset  . Heart attack Father   . Hyperlipidemia Mother     Social History Social History  Substance Use Topics  . Smoking status: Never Smoker  . Smokeless tobacco: Never Used  . Alcohol use 1.8 oz/week    3 Standard drinks  or equivalent per week     Allergies   Bee venom   Review of Systems Review of Systems  Constitutional: Positive for chills, diaphoresis and fatigue. Negative for activity change, appetite change, fever and unexpected weight change.  HENT: Negative.   Eyes: Negative.   Respiratory: Negative.   Cardiovascular: Negative.   Gastrointestinal: Positive for abdominal pain, diarrhea and nausea. Negative for blood in stool and vomiting.  Endocrine: Negative.   Genitourinary: Negative.   Musculoskeletal: Negative.   Skin: Negative.   Neurological: Positive for tremors and headaches. Negative for dizziness, seizures, syncope, speech difficulty, weakness, light-headedness and  numbness.     Physical Exam Triage Vital Signs ED Triage Vitals  Enc Vitals Group     BP 10/02/16 1525 141/78     Pulse Rate 10/02/16 1525 84     Resp 10/02/16 1525 18     Temp 10/02/16 1525 98 F (36.7 C)     Temp Source 10/02/16 1525 Oral     SpO2 10/02/16 1525 99 %     Weight --      Height --      Head Circumference --      Peak Flow --      Pain Score 10/02/16 1527 0     Pain Loc --      Pain Edu? --      Excl. in [place]? --    No data found.   Updated Vital Signs BP 141/78   Pulse 84   Temp 98 F (36.7 C) (Oral)   Resp 18   SpO2 99%   Visual Acuity Right Eye Distance:   Left Eye Distance:   Bilateral Distance:    Right Eye Near:   Left Eye Near:    Bilateral Near:     Physical Exam Nursing notes and Vital Signs reviewed. Appearance:  Patient appears stated age, and in no acute distress.  She is alert and oriented.  Eyes:  Pupils are equal, round, and reactive to light and accomodation.  Extraocular movement is intact.  Conjunctivae are not inflamed.  Fundi benign.  Ears:  Canals normal.  Tympanic membranes normal.  Nose:   Normal turbinates.  No sinus tenderness.    Pharynx:  Normal Neck:  Supple.  No adenopathy. Lungs:  Clear to auscultation.  Breath sounds are equal.  Moving air well. Heart:  Regular rate and rhythm without murmurs, rubs, or gallops.  Abdomen:  Nontender without masses or hepatosplenomegaly.  Bowel sounds are present.  No CVA or flank tenderness.  Extremities:  No edema.  Skin:  No rash present.   Neurologic:  Cranial nerves 2 through 12 are normal.  Patellar, achilles, and elbow reflexes are normal.  Cerebellar function is intact (finger-to-nose and rapid alternating hand movement).  Gait and station are normal.  Grip strength symmetric bilaterally.  Romberg negative.  Note presence of bilateral action tremor hands. Tremor resolved just prior to patient's departure. UC Treatments / Results  Labs (all labs ordered are listed, but only  abnormal results are displayed) Labs Reviewed  POCT FASTING CBG [facility] MANUAL ENTRY - Abnormal; Notable for the following:       Result Value   POCT Glucose ([facility]) 127 (*)    All other components within normal limits  TSH  COMPLETE METABOLIC PANEL WITH GFR  POCT CBC W AUTO DIFF ([facility]):  WBC 6.2; LY 32.6; MO 2.5; GR 64.9; Hgb 13.7; Platelets 212     EKG  EKG Interpretation  None       Radiology No results found.  Procedures Procedures (including critical care time)  Medications Ordered in UC Medications - No data to display   Initial Impression / Assessment and Plan / UC Course  I have reviewed the triage vital signs and the nursing notes.  Pertinent labs & imaging results that were available during my care of the patient were reviewed by me and considered in my medical decision making (see chart for details).  Clinical Course   Note tremor resolved prior to discharge:  ?anxiety. Patient has a history of hypothyroid; her symptoms of tremor, sweats, nausea, and diarrhea suggest possible hyperthyroid. Chart review indicates her last TSH five months ago was normal:  2.31 Note normal CBC.  Check TSH and CMP Followup with Family Doctor     Final Clinical Impressions(s) / UC Diagnoses   Final diagnoses:  Tremor    New Prescriptions New Prescriptions   No medications on file     Kandra Nicolas, MD 10/04/16 1202

## 2016-10-02 NOTE — ED Triage Notes (Signed)
Patient c/o nausea and abdominal pain intermittently x 2 weeks. About 1 hour ago she developed tremors. She has eaten a piece of cake and drank some cheerwine. No new medications taken. She feels cold, denies pain.

## 2016-10-03 ENCOUNTER — Telehealth: Payer: Self-pay | Admitting: *Deleted

## 2016-10-03 LAB — COMPLETE METABOLIC PANEL WITH GFR
ALBUMIN: 4.1 g/dL (ref 3.6–5.1)
ALK PHOS: 84 U/L (ref 33–130)
ALT: 33 U/L — ABNORMAL HIGH (ref 6–29)
AST: 31 U/L (ref 10–35)
BILIRUBIN TOTAL: 0.6 mg/dL (ref 0.2–1.2)
BUN: 22 mg/dL (ref 7–25)
CO2: 22 mmol/L (ref 20–31)
Calcium: 9.6 mg/dL (ref 8.6–10.4)
Chloride: 108 mmol/L (ref 98–110)
Creat: 0.81 mg/dL (ref 0.60–0.93)
GFR, EST AFRICAN AMERICAN: 85 mL/min (ref >=60)
GFR, EST NON AFRICAN AMERICAN: 73 mL/min (ref >=60)
GLUCOSE: 98 mg/dL (ref 65–99)
Potassium: 3.7 mmol/L (ref 3.5–5.3)
Sodium: 141 mmol/L (ref 135–146)
TOTAL PROTEIN: 6.3 g/dL (ref 6.1–8.1)

## 2016-10-03 LAB — TSH: TSH: 0.02 mIU/L — ABNORMAL LOW

## 2016-10-03 NOTE — Telephone Encounter (Signed)
Callback: Patient reports tremors have subsided, still c/o some nausea.

## 2016-10-03 NOTE — Telephone Encounter (Signed)
Patient advised of lab results and encouraged f/u with Dr. Sheppard Coil regarding her TSH as she has been taking levothyroxine as prescribed.

## 2016-10-09 ENCOUNTER — Encounter: Payer: Self-pay | Admitting: Osteopathic Medicine

## 2016-10-09 ENCOUNTER — Ambulatory Visit (INDEPENDENT_AMBULATORY_CARE_PROVIDER_SITE_OTHER): Payer: Commercial Managed Care - HMO | Admitting: Osteopathic Medicine

## 2016-10-09 ENCOUNTER — Ambulatory Visit: Payer: Commercial Managed Care - HMO | Admitting: Osteopathic Medicine

## 2016-10-09 VITALS — BP 115/74 | HR 79 | Wt 131.0 lb

## 2016-10-09 DIAGNOSIS — E039 Hypothyroidism, unspecified: Secondary | ICD-10-CM | POA: Diagnosis not present

## 2016-10-09 DIAGNOSIS — Z1211 Encounter for screening for malignant neoplasm of colon: Secondary | ICD-10-CM | POA: Insufficient documentation

## 2016-10-09 DIAGNOSIS — F32A Depression, unspecified: Secondary | ICD-10-CM

## 2016-10-09 DIAGNOSIS — R413 Other amnesia: Secondary | ICD-10-CM | POA: Diagnosis not present

## 2016-10-09 DIAGNOSIS — E785 Hyperlipidemia, unspecified: Secondary | ICD-10-CM

## 2016-10-09 DIAGNOSIS — K219 Gastro-esophageal reflux disease without esophagitis: Secondary | ICD-10-CM

## 2016-10-09 DIAGNOSIS — F329 Major depressive disorder, single episode, unspecified: Secondary | ICD-10-CM

## 2016-10-09 DIAGNOSIS — N951 Menopausal and female climacteric states: Secondary | ICD-10-CM | POA: Diagnosis not present

## 2016-10-09 MED ORDER — ESTROVEN ENERGY PO TABS: ORAL_TABLET | ORAL | 0 refills | Status: DC

## 2016-10-09 MED ORDER — ATORVASTATIN CALCIUM 40 MG PO TABS: 40.0000 mg | ORAL_TABLET | Freq: Every day | ORAL | 3 refills | Status: DC

## 2016-10-09 MED ORDER — OMEPRAZOLE 20 MG PO CPDR: 20.0000 mg | DELAYED_RELEASE_CAPSULE | Freq: Every day | ORAL | 3 refills | Status: DC

## 2016-10-09 MED ORDER — LEVOTHYROXINE SODIUM 75 MCG PO TABS: 75.0000 ug | ORAL_TABLET | Freq: Every day | ORAL | 3 refills | Status: DC

## 2016-10-09 NOTE — Progress Notes (Signed)
HPI: Suzanne Patrick is a 70 y.o. female  who presents to [facility] today, 10/09/16,  for chief complaint of:  Chief Complaint  Patient presents with  . Hospitalization Follow-up    seen for trembling    Tremor . Context: seen in UC for tremor 10/02/16 (1 week ago) "Patient complains of acute onset of bilateral hand tremors, without other neurologic symptoms, about 1.5 hours ago.  She has also had sweats and chills today.  She states that she has had intermittent headache for 2 to 3 weeks, nausea without vomiting for about 2 weeks, and 2 week history of diarrhea occurring 2 to 3 times daily.  She also complains of intermittent vague left lower quadrant pain for about 6 months.  She admits that she has felt nervous and tense today." TSH and CBC checked, tremor noted on exam but resolved by time of D/C. Pt called 11/22 and tremors had resolved, nausea persisted. TSH was low.  . Location/Quality:Hands were tremoring . Duration: Overall lasted a few hours and resolved spontaneously . Modifying factors: Abnormal thyroid labs . Assoc signs/symptoms: Patient is anxious that something may have been wrong with her in light of the tremor plus other symptoms, she is not worried about that anymore and thinks anxiety may have been something to do with it at the time. Over thyroid labs were abnormal  Patient also has some questions about other medications that she may be able to discontinue.  Postmenopausal: Is on hormone replacement therapy, is that she hasn't.with hot flashes and several years  Memory: Is on Aricept without diagnosis of dementia  Mood: Is on antidepressant medication, patient denies history of depression/anxiety but reports some mood issues on occasion. Pressure and is on her problem list  GERD: Patient is on omeprazole, denies heartburn type symptoms or history of ulcer  Patient is accompanied by husband who assists with history-taking.   Past  medical, surgical, social and family history reviewed: Patient Active Problem List   Diagnosis Date Noted  . Globus sensation 09/02/2016  . Benign skin growth 08/23/2016  . Adnexal mass 04/23/2016  . 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 Memory problem 12/06/2015  . Chronic tonsillitis 11/02/2015  . 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 Hypothyroidism 04/01/2015  . Hyperlipidemia 04/01/2015  . Depression 04/01/2015  . Anemia 04/01/2015  . Compression fracture of L1 lumbar vertebra ([place]) 04/01/2015   Past Surgical History:  Procedure Laterality Date  . BACK SURGERY    . TUBAL LIGATION     Social History  Substance Use Topics  . Smoking status: Never Smoker  . Smokeless tobacco: Never Used  . Alcohol use 1.8 oz/week    3 Standard drinks or equivalent per week   Family History  Problem Relation Age of Onset  . Heart attack Father   . Hyperlipidemia Mother      Current medication list and allergy/intolerance information reviewed:   Current Outpatient Prescriptions on File Prior to Visit  Medication Sig Dispense Refill  . aspirin 81 MG EC tablet Take 81 mg by mouth.    Marland Kitchen atorvastatin (LIPITOR) 40 MG tablet Take 1 tablet (40 mg total) by mouth daily. 90 tablet 1  . citalopram (CELEXA) 20 MG tablet Take 1 tablet (20 mg total) by mouth daily. To help with mood. 90 tablet 2  . donepezil (ARICEPT) 10 MG tablet Take 1 tablet (10 mg total) by mouth at bedtime. For Memory 90 tablet 1  . estrogens, conjugated, (PREMARIN) 0.625 MG tablet  Take 1 tablet (0.625 mg total) by mouth daily. Take along with progesterone pill. 30 tablet 2  . levothyroxine (SYNTHROID, LEVOTHROID) 75 MCG tablet Take 1 tablet (75 mcg total) by mouth daily before breakfast. 90 tablet 1  . lidocaine (XYLOCAINE) 2 % solution 35mL gargled in the mouth then swallowed every six hours only as needed for throat pain. 250 mL 11  . liothyronine (CYTOMEL) 25 MCG tablet Take 1 tablet (25 mcg total) by mouth daily. 90 tablet 1  .  meloxicam (MOBIC) 15 MG tablet Take 1 tablet (15 mg total) by mouth daily as needed (sore throat). 30 tablet 0  . omeprazole (PRILOSEC) 20 MG capsule Take 1 capsule (20 mg total) by mouth daily. 90 capsule 1  . progesterone (PROMETRIUM) 100 MG capsule TAKE 1 CAPSULE DAILY WITH ESTROGEN 90 capsule 0   No current facility-administered medications on file prior to visit.    Allergies  Allergen Reactions  . Bee Venom Anaphylaxis      Review of Systems:  Constitutional: No recent illness  HEENT: No  headache, no vision change  Cardiac: No  chest pain, No  pressure, No palpitations  Respiratory:  No  shortness of breath. No  Cough  Gastrointestinal: No  abdominal pain, no change on bowel habits  Musculoskeletal: No new myalgia/arthralgia  Skin: No  Rash  Hem/Onc: No  easy bruising/bleeding, No  abnormal lumps/bumps  Neurologic: No  weakness, No  Dizziness  Psychiatric: No  concerns with depression, No  concerns with anxiety  Exam:  BP 115/74   Pulse 79   Wt 131 lb (59.4 kg)   BMI 21.80 kg/m   Constitutional: VS see above. General Appearance: alert, well-developed, well-nourished, NAD  Eyes: Normal lids and conjunctive, non-icteric sclera  Ears, Nose, Mouth, Throat: MMM, Normal external inspection ears/nares/mouth/lips/gums.  Neck: No masses, trachea midline.   Respiratory: Normal respiratory effort. no wheeze, no rhonchi, no rales  Cardiovascular: S1/S2 normal, no murmur, no rub/gallop auscultated. RRR.   Musculoskeletal: Gait normal. Symmetric and independent movement of all extremities  Neurological: Normal balance/coordination. No tremor.  Skin: warm, dry, intact.   Psychiatric: Normal judgment/insight. Normal mood and affect. Oriented x3.      ASSESSMENT/PLAN:   Hypothyroidism, unspecified type - Discontinue Cytomel, recheck on just Synthroid - Plan: Thyroid Panel With TSH  Screening for colon cancer - Patient requests cologuard screening - Plan:  Cologuard  Memory problem - No history of dementia, I doubt Aricept is making much difference, option to discontinue  Menopause syndrome - Option to discontinue estrogen/progesterone  Hyperlipidemia, unspecified hyperlipidemia type - We'll keep on statin for now as well as aspirin until recheck levels consider discontinuation of statin  Depression, unspecified depression type - Consider reduced dose of citalopram and discontinue if patient tolerating low-dose for about a month  Gastroesophageal reflux disease, esophagitis presence not specified - Option to discontinue omeprazole, transitioned as needed Tums/Pepto-Bismol or ranitidine    Patient Instructions  Stop the Cytomel. Continue the Levothyroxine.  Plan to repeat thyroid blood work in 8 weeks (approx. 12/09/16)  Annual physical due in 04/2017. See medication list for possible changes - I'll leave the list as it is for now and we can reevaluate at your annual.     Visit summary with medication list and pertinent instructions was printed for patient to review. All questions at time of visit were answered - patient instructed to contact office with any additional concerns. ER/RTC precautions were reviewed with the patient. Follow-up plan: Return in  about 6 months (around 04/08/2017) for ANNUAL PHYSICAL.  Note: Total time spent 40 minutes, greater than 50% of the visit was spent face-to-face counseling and coordinating care for the following: The primary encounter diagnosis was Hypothyroidism, unspecified type. Diagnoses of Screening for colon cancer, Memory problem, Menopause syndrome, Hyperlipidemia, unspecified hyperlipidemia type, Depression, unspecified depression type, and Gastroesophageal reflux disease, esophagitis presence not specified were also pertinent to this visit.Marland Kitchen

## 2016-10-09 NOTE — Patient Instructions (Addendum)
Stop the Cytomel. Continue the Levothyroxine.  Plan to repeat thyroid blood work in 8 weeks (approx. 12/09/16)  Annual physical due in 04/2017. See medication list for possible changes - I'll leave the list as it is for now and we can reevaluate at your annual.

## 2016-10-17 ENCOUNTER — Ambulatory Visit (INDEPENDENT_AMBULATORY_CARE_PROVIDER_SITE_OTHER): Payer: Commercial Managed Care - HMO | Admitting: Family Medicine

## 2016-10-17 ENCOUNTER — Encounter: Payer: Self-pay | Admitting: Family Medicine

## 2016-10-17 VITALS — BP 135/73 | HR 78 | Wt 127.0 lb

## 2016-10-17 DIAGNOSIS — R198 Other specified symptoms and signs involving the digestive system and abdomen: Secondary | ICD-10-CM

## 2016-10-17 DIAGNOSIS — R05 Cough: Secondary | ICD-10-CM

## 2016-10-17 DIAGNOSIS — J309 Allergic rhinitis, unspecified: Secondary | ICD-10-CM | POA: Diagnosis not present

## 2016-10-17 DIAGNOSIS — K219 Gastro-esophageal reflux disease without esophagitis: Secondary | ICD-10-CM | POA: Diagnosis not present

## 2016-10-17 DIAGNOSIS — F458 Other somatoform disorders: Secondary | ICD-10-CM | POA: Diagnosis not present

## 2016-10-17 DIAGNOSIS — R058 Other specified cough: Secondary | ICD-10-CM

## 2016-10-17 DIAGNOSIS — R0989 Other specified symptoms and signs involving the circulatory and respiratory systems: Secondary | ICD-10-CM

## 2016-10-17 MED ORDER — IPRATROPIUM BROMIDE 0.06 % NA SOLN: 1.0000 | Freq: Two times a day (BID) | NASAL | 3 refills | Status: DC

## 2016-10-17 MED ORDER — FLUTICASONE PROPIONATE 50 MCG/ACT NA SUSP: 1.0000 | Freq: Two times a day (BID) | NASAL | 3 refills | Status: DC

## 2016-10-17 MED ORDER — CETIRIZINE HCL 10 MG PO TABS: 10.0000 mg | ORAL_TABLET | Freq: Every day | ORAL | 11 refills | Status: DC

## 2016-10-17 NOTE — Progress Notes (Signed)
Suzanne Patrick is a 70 y.o. female who presents to [place] [place] today for cough x 5 months.  Patient states she has persistent secretions in her throat and regurgitates "slime." Says her throat "feels like it's burning." Endorses decreased appetite, acid reflux and belching. Has tried Robitussin throat spray and OTC cough medicine. She takes Tums as needed for heartburn. Omeprazole is on her medication list but she is not sure if she takes it. She states she has seasonal allergies and is not currently taking antihistamines. She has never smoked and has no history of pulmonary disease incl. asthma, COPD, bronchitis. She is not on an ACEi and has not had any recent medication changes. She has never had an upper endoscopy.  She was seen by ENT a few months ago, had a negative workup including fiberoptic laryngoscopy, and was told "go home and drink water."   No past medical history on file. Past Surgical History:  Procedure Laterality Date  . BACK SURGERY    . TUBAL LIGATION     Social History  Substance Use Topics  . Smoking status: Never Smoker  . Smokeless tobacco: Never Used  . Alcohol use 1.8 oz/week    3 Standard drinks or equivalent per week   family history includes Heart attack in her father; Hyperlipidemia in her mother.  ROS as above:  Medications: Current Outpatient Prescriptions  Medication Sig Dispense Refill  . aspirin 81 MG EC tablet Take 81 mg by mouth.    Marland Kitchen atorvastatin (LIPITOR) 40 MG tablet Take 1 tablet (40 mg total) by mouth daily. 90 tablet 3  . citalopram (CELEXA) 20 MG tablet Take 1 tablet (20 mg total) by mouth daily. To help with mood. 90 tablet 2  . donepezil (ARICEPT) 10 MG tablet Take 1 tablet (10 mg total) by mouth at bedtime. For Memory 90 tablet 1  . estrogens, conjugated, (PREMARIN) 0.625 MG tablet Take 1 tablet (0.625 mg total) by mouth  daily. Take along with progesterone pill. 30 tablet 2  . levothyroxine (SYNTHROID, LEVOTHROID) 75 MCG tablet Take 1 tablet (75 mcg total) by mouth daily before breakfast. 90 tablet 3  . meloxicam (MOBIC) 15 MG tablet Take 1 tablet (15 mg total) by mouth daily as needed (sore throat). 30 tablet 0  . Misc Natural Products (ESTROVEN ENERGY) TABS 1 tablet daily 30 tablet 0  . omeprazole (PRILOSEC) 20 MG capsule Take 1 capsule (20 mg total) by mouth daily. 90 capsule 3  . progesterone (PROMETRIUM) 100 MG capsule TAKE 1 CAPSULE DAILY WITH ESTROGEN 90 capsule 0  . cetirizine (ZYRTEC) 10 MG tablet Take 1 tablet (10 mg total) by mouth daily. 30 tablet 11  . fluticasone (FLONASE) 50 MCG/ACT nasal spray Place 1 spray into both nostrils 2 (two) times daily. 16 g 3  . ipratropium (ATROVENT) 0.06 % nasal spray Place 1 spray into both nostrils 2 (two) times daily. 15 mL 3   No current facility-administered medications for this visit.    Allergies  Allergen Reactions  . Bee Venom Anaphylaxis    Health Maintenance Health Maintenance  Topic Date Due  . DEXA SCAN  08/12/2017 (Originally 09/05/2010)  . ZOSTAVAX  08/12/2017 (Originally 09/05/2005)  . Hepatitis C Screening  08/12/2017 (Originally 1946/08/29)  . MAMMOGRAM  01/04/2017  . COLONOSCOPY  11/12/2017  . TETANUS/TDAP  07/17/2026  . INFLUENZA VACCINE  Completed  . PNA vac Low Risk Adult  Completed     Exam:  BP 135/73  Pulse 78   Wt 127 lb (57.6 kg)   BMI 21.13 kg/m  Gen: Alert, not ill-appearing, no distress HEENT: Extraocular movements intact, TM's normal, oropharynx with cobblestoning and post-nasal secretions, moist mucus membranes Lungs: Normal work of breathing, clear to auscultation bilaterally Heart: Normal rate, regular rhythm, s1 and s2 distinct, no murmurs, clicks or rubs appreciated on this exam Skin: Brisk capillary refill, warm and dry, no rashes or lesions on exposed skin    Assessment and Plan: 70 y.o. female with  chronic cough, likely multifactorial due to post-nasal drip/upper airway syndrome and GERD. Trialing daily Zyrtec, twice daily Flonase/Ipratropium and PPI daily to see if symptoms improve.  If no improvement, we discussed further work-up and referral to GI Patient education and anticipatory guidance given Patient agrees with treatment plan Follow-up in 3-4 weeks   1. Upper airway cough syndrome - cetirizine (ZYRTEC) 10 MG tablet; Take 1 tablet (10 mg total) by mouth daily.  Dispense: 30 tablet; Refill: 11 - fluticasone (FLONASE) 50 MCG/ACT nasal spray; Place 1 spray into both nostrils 2 (two) times daily.  Dispense: 16 g; Refill: 3  2. Chronic allergic rhinitis, unspecified seasonality, unspecified trigger - cetirizine (ZYRTEC) 10 MG tablet; Take 1 tablet (10 mg total) by mouth daily.  Dispense: 30 tablet; Refill: 11 - fluticasone (FLONASE) 50 MCG/ACT nasal spray; Place 1 spray into both nostrils 2 (two) times daily.  Dispense: 16 g; Refill: 3 - ipratropium (ATROVENT) 0.06 % nasal spray; Place 1 spray into both nostrils 2 (two) times daily.  Dispense: 15 mL; Refill: 3  3. Gastroesophageal reflux disease, esophagitis presence not specified Prilosec 20mg  daily   I spent 25 minutes with this patient, greater than 50% was face-to-face time counseling regarding the above diagnoses

## 2016-10-17 NOTE — Patient Instructions (Addendum)
Make sure you are taking Omeprazole/Prilosec 20mg  daily for acid reflux Start taking Cetirizine/Zyrtec (antihistamine) daily Start taking Flonase nasal spray, 1 spray in each nostril twice a day Please return in 3-4 weeks if you are not feeling better  To use the Flonase nasal spray:   When you use the fluticasone nasal spray for the first time, you must prime the spray. Press down fully the top of the pump 6 times or until a fine spray comes out. Prime the spray if it has not been used for more than 7 days (fluticasone propionate) or 30 days (Veramyst) or if the cap has been left off the bottle for 5 days or longer. Shake the medicine well before each use.  Gently blow your nose before using the spray. Tilt your head back slightly and insert the tip of the nose piece into your nostril.  Close the opposite nostril with a finger. Release 1 spray and at the same time, breathe in gently through the nostril.  Hold your breath for a few seconds then breathe out slowly through your mouth.  Spray the opposite nostril using the same steps.  Do not blow your nose or tip your head back after using the spray.  Wipe the tip of the outside of the nose piece with a clean, dry tissue or cloth and put the cap back on.  Throw this medicine away after you use 120 sprays.  You may need to use this medicine for a few days before you start to feel better.

## 2016-10-30 DIAGNOSIS — Z1211 Encounter for screening for malignant neoplasm of colon: Secondary | ICD-10-CM | POA: Diagnosis not present

## 2016-10-30 DIAGNOSIS — Z1212 Encounter for screening for malignant neoplasm of rectum: Secondary | ICD-10-CM | POA: Diagnosis not present

## 2016-11-07 LAB — COLOGUARD: Cologuard: NEGATIVE

## 2016-11-08 ENCOUNTER — Telehealth: Payer: Self-pay | Admitting: Osteopathic Medicine

## 2016-11-08 NOTE — Telephone Encounter (Signed)
Please call patient: Cologuard test was negative for colon cancer, plan to repeat testing in 3 years

## 2016-11-08 NOTE — Telephone Encounter (Signed)
Spoke to patient gave her results as noted below. Rhonda Cunningham,CMA  

## 2016-11-14 ENCOUNTER — Other Ambulatory Visit: Payer: Self-pay | Admitting: Osteopathic Medicine

## 2016-11-14 DIAGNOSIS — E039 Hypothyroidism, unspecified: Secondary | ICD-10-CM

## 2016-11-27 ENCOUNTER — Other Ambulatory Visit: Payer: Self-pay

## 2016-11-27 DIAGNOSIS — E039 Hypothyroidism, unspecified: Secondary | ICD-10-CM

## 2016-11-28 LAB — THYROID PANEL WITH TSH
Free Thyroxine Index: 2 (ref 1.4–3.8)
T3 Uptake: 28 % (ref 22–35)
T4 TOTAL: 7 ug/dL (ref 4.5–12.0)
TSH: 0.02 m[IU]/L — AB

## 2016-11-29 ENCOUNTER — Encounter: Payer: Self-pay | Admitting: Osteopathic Medicine

## 2016-12-04 ENCOUNTER — Telehealth: Payer: Self-pay

## 2016-12-04 DIAGNOSIS — E039 Hypothyroidism, unspecified: Secondary | ICD-10-CM

## 2016-12-04 MED ORDER — LEVOTHYROXINE SODIUM 88 MCG PO TABS: 88.0000 ug | ORAL_TABLET | Freq: Every day | ORAL | 0 refills | Status: DC

## 2016-12-04 NOTE — Telephone Encounter (Signed)
Patient and her spouse called back in regards to new dose of  levothyroxine Rx that was never sent to pharmacy. Please see note below: Ronnell Clinger,CMA   Notes Recorded by Doree Albee, CMA on 11/30/2016 at 1:42 PM EST Spoke to patient gave her results as noted below. She stated that she is taking the Levothyroxine only. Arali Somera,CMA  ------  Notes Recorded by Emeterio Reeve, DO on 11/30/2016 at 7:48 AM EST Please call patient: Thyroid levels indicate that dose of medicine is still a bit too high. Please confirm that she is only taking the levothyroxine, no longer taking the Cytomel. She is taking both, she should be only taking the levothyroxine. If she is currently only taking her levothyroxine, let me know and I'll call in a slightly lower dose.

## 2016-12-04 NOTE — Telephone Encounter (Signed)
Medicine sent. Plan to recheck labs 2 months

## 2016-12-04 NOTE — Telephone Encounter (Signed)
Patient has been informed. Rhonda Cunningham,CMA  

## 2016-12-07 ENCOUNTER — Ambulatory Visit (INDEPENDENT_AMBULATORY_CARE_PROVIDER_SITE_OTHER): Payer: Commercial Managed Care - HMO | Admitting: Osteopathic Medicine

## 2016-12-07 VITALS — BP 126/67 | HR 80 | Temp 98.4°F | Wt 127.0 lb

## 2016-12-07 DIAGNOSIS — R300 Dysuria: Secondary | ICD-10-CM | POA: Diagnosis not present

## 2016-12-07 DIAGNOSIS — N3000 Acute cystitis without hematuria: Secondary | ICD-10-CM

## 2016-12-07 LAB — POCT URINALYSIS DIPSTICK
Glucose, UA: NEGATIVE
NITRITE UA: NEGATIVE
Protein, UA: NEGATIVE
RBC UA: NEGATIVE
Spec Grav, UA: 1.02
Urobilinogen, UA: 1
pH, UA: 5.5

## 2016-12-07 MED ORDER — CIPROFLOXACIN HCL 500 MG PO TABS: 500.0000 mg | ORAL_TABLET | Freq: Two times a day (BID) | ORAL | 0 refills | Status: DC

## 2016-12-07 NOTE — Addendum Note (Signed)
Addended by: Doree Albee on: 12/07/2016 04:04 PM   Modules accepted: Orders

## 2016-12-07 NOTE — Progress Notes (Signed)
Chief Complaint: Possible UTI  History of Present Illness: Suzanne Patrick is a 71 y.o. female who presents to [facility]  today with concerns for Chief Complaint  Patient presents with  . Other    kidney concerns    Onset: 2 weeks  Quality: Burning/Urgency/frequency Associated Symptoms: see ROS below Context:  Previous UTI: long time   Recurrent UTI (3 times/more annually): no  Abx in past 3 months: no  Complicated (any of the following): yes ?Diabetes ?Pregnancy ?Symptoms for seven or more days before seeking care ?Hospital acquired infection ?Renal failure ?Urinary tract obstruction ?Indwelling urethral catheter, stent, nephrostomy tube or urinary diversion ?Functional/anatomic abnormality of the urinary tract ?Renal transplantation ?Immunosuppression   Past medical, social and family history reviewed: No past medical history on file. Past Surgical History:  Procedure Laterality Date  . BACK SURGERY    . TUBAL LIGATION     Social History  Substance Use Topics  . Smoking status: Never Smoker  . Smokeless tobacco: Never Used  . Alcohol use 1.8 oz/week    3 Standard drinks or equivalent per week   The patient has a family history of  Current Outpatient Prescriptions  Medication Sig Dispense Refill  . aspirin 81 MG EC tablet Take 81 mg by mouth.    Marland Kitchen atorvastatin (LIPITOR) 40 MG tablet Take 1 tablet (40 mg total) by mouth daily. 90 tablet 3  . cetirizine (ZYRTEC) 10 MG tablet Take 1 tablet (10 mg total) by mouth daily. 30 tablet 11  . citalopram (CELEXA) 20 MG tablet Take 1 tablet (20 mg total) by mouth daily. To help with mood. 90 tablet 2  . donepezil (ARICEPT) 10 MG tablet Take 1 tablet (10 mg total) by mouth at bedtime. For Memory 90 tablet 1  . estrogens, conjugated, (PREMARIN) 0.625 MG tablet Take 1 tablet (0.625 mg total) by mouth daily. Take along with progesterone pill. 30 tablet 2  . fluticasone (FLONASE) 50 MCG/ACT  nasal spray Place 1 spray into both nostrils 2 (two) times daily. 16 g 3  . ipratropium (ATROVENT) 0.06 % nasal spray Place 1 spray into both nostrils 2 (two) times daily. 15 mL 3  . levothyroxine (SYNTHROID, LEVOTHROID) 88 MCG tablet Take 1 tablet (88 mcg total) by mouth daily before breakfast. Due for lab recheck when running low on medicine 60 tablet 0  . meloxicam (MOBIC) 15 MG tablet Take 1 tablet (15 mg total) by mouth daily as needed (sore throat). 30 tablet 0  . Misc Natural Products (ESTROVEN ENERGY) TABS 1 tablet daily 30 tablet 0  . omeprazole (PRILOSEC) 20 MG capsule Take 1 capsule (20 mg total) by mouth daily. 90 capsule 3  . progesterone (PROMETRIUM) 100 MG capsule TAKE 1 CAPSULE DAILY WITH ESTROGEN 90 capsule 0   No current facility-administered medications for this visit.    Allergies  Allergen Reactions  . Bee Venom Anaphylaxis     Review of Systems: CONSTITUTIONAL: Negative fever/chills CARDIAC: No chest pain/pressure/palpitations, no orthopnea RESPIRATORY: No cough/shortness of breath/wheeze GASTROINTESTINAL: No nausea/vomiting/abdominal pain/blood in stool/diarrhea/constipation MUSCULOSKELETAL: see below re: flank pain GENITOURINARY:    Frequency: yes  Hematuria: no  Odor: no  Incontinence: no  Flank Pain: no  Vaginal bleeding/discharge: no   Exam:  BP 126/67   Pulse 80   Temp 98.4 F (36.9 C) (Oral)   Wt 127 lb (57.6 kg)   BMI 21.13 kg/m  Constitutional: VSS, see above. General Appearance: alert, well-developed, well-nourished, NAD Respiratory: Normal respiratory  effort. Breath sounds normal, no wheeze/rhonchi/rales Cardiovascular: S1/S2 normal, no murmur/rub/gallop auscultated. RRR Gastrointestinal: Nontender, no masses. No hepatomegaly, no splenomegaly. No hernia appreciated. Rectal exam deferred.  Musculoskeletal: Gait normal. No clubbing/cyanosis of digits. Lloyd sign Negative bilateral  Results for orders placed or performed in visit on 12/07/16  (from the past 24 hour(s))  POCT Urinalysis Dipstick     Status: Abnormal   Collection Time: 12/07/16  8:23 AM  Result Value Ref Range   Color, UA yellow    Clarity, UA clear    Glucose, UA negative    Bilirubin, UA small    Ketones, UA trace    Spec Grav, UA 1.020    Blood, UA negative    pH, UA 5.5    Protein, UA negative    Urobilinogen, UA 1.0    Nitrite, UA negative    Leukocytes, UA Trace (A) Negative    Previous Culture Results: none available   ASSESSMENT/PLAN: Specimen cup provided for patient to come back with urine sample for culture as there was not enough to run after the urine dipstick right now.  Acute cystitis without hematuria - Plan: ciprofloxacin (CIPRO) 500 MG tablet  Dysuria - Plan: POCT Urinalysis Dipstick, Urine culture pending    Patient advised we will call with urine culture results once available, depending on results may need to change therapy. ER precautions reviewed with regard to abdominal pain, nausea/vomiting, fever, flank pain. Return if symptoms worsen or fail to improve.

## 2016-12-09 LAB — URINE CULTURE: ORGANISM ID, BACTERIA: NO GROWTH

## 2016-12-11 ENCOUNTER — Ambulatory Visit (INDEPENDENT_AMBULATORY_CARE_PROVIDER_SITE_OTHER): Payer: Commercial Managed Care - HMO | Admitting: Osteopathic Medicine

## 2016-12-11 ENCOUNTER — Encounter: Payer: Self-pay | Admitting: Osteopathic Medicine

## 2016-12-11 VITALS — BP 137/79 | HR 90 | Wt 128.0 lb

## 2016-12-11 DIAGNOSIS — R350 Frequency of micturition: Secondary | ICD-10-CM

## 2016-12-11 DIAGNOSIS — N898 Other specified noninflammatory disorders of vagina: Secondary | ICD-10-CM | POA: Diagnosis not present

## 2016-12-11 DIAGNOSIS — N760 Acute vaginitis: Secondary | ICD-10-CM | POA: Diagnosis not present

## 2016-12-11 DIAGNOSIS — B9689 Other specified bacterial agents as the cause of diseases classified elsewhere: Secondary | ICD-10-CM

## 2016-12-11 DIAGNOSIS — N952 Postmenopausal atrophic vaginitis: Secondary | ICD-10-CM | POA: Diagnosis not present

## 2016-12-11 LAB — WET PREP, GENITAL
Trich, Wet Prep: NONE SEEN
Yeast Wet Prep HPF POC: NONE SEEN

## 2016-12-11 MED ORDER — ESTRADIOL 0.1 MG/GM VA CREA: TOPICAL_CREAM | VAGINAL | 12 refills | Status: DC

## 2016-12-11 NOTE — Progress Notes (Signed)
HPI: Suzanne Patrick is a 71 y.o. female  who presents to [facility] today, 12/12/16,  for chief complaint of:  Chief Complaint  Patient presents with  . Urinary Frequency     . Context: Recently seen and treated for urinary tract infection. Has been on Cipro 4 days and has noticed no difference, urine culture was negative, we did not have enough urine sample to do a urine microscopy. . Quality: Dysuria and urinary frequency . Duration: almost 3 weeks . Assoc signs/symptoms: No abdominal pain, no fever/chills, no vaginal bleeding/discharge   Past medical history, surgical history, social history and family history reviewed.  Patient Active Problem List   Diagnosis Date Noted  . Upper airway cough syndrome 10/17/2016  . Chronic allergic rhinitis 10/17/2016  . Gastroesophageal reflux disease 10/17/2016  . Screening for colon cancer 10/09/2016  . Globus sensation 09/02/2016  . Benign skin growth 08/23/2016  . Adnexal mass 04/23/2016  . 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 Memory problem 12/06/2015  . Chronic tonsillitis 11/02/2015  . 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 Hypothyroidism 04/01/2015  . Hyperlipidemia 04/01/2015  . Depression 04/01/2015  . Anemia 04/01/2015  . Compression fracture of L1 lumbar vertebra (HCC) 04/01/2015    Current medication list and allergy/intolerance information reviewed.   Current Outpatient Prescriptions on File Prior to Visit  Medication Sig Dispense Refill  . aspirin 81 MG EC tablet Take 81 mg by mouth.    Marland Kitchen atorvastatin (LIPITOR) 40 MG tablet Take 1 tablet (40 mg total) by mouth daily. 90 tablet 3  . cetirizine (ZYRTEC) 10 MG tablet Take 1 tablet (10 mg total) by mouth daily. 30 tablet 11  . ciprofloxacin (CIPRO) 500 MG tablet Take 1 tablet (500 mg total) by mouth 2 (two) times daily. For 10 days 20 tablet 0  . citalopram (CELEXA) 20 MG tablet Take 1 tablet (20 mg total) by mouth daily. To help with mood. 90  tablet 2  . donepezil (ARICEPT) 10 MG tablet Take 1 tablet (10 mg total) by mouth at bedtime. For Memory 90 tablet 1  . estrogens, conjugated, (PREMARIN) 0.625 MG tablet Take 1 tablet (0.625 mg total) by mouth daily. Take along with progesterone pill. 30 tablet 2  . fluticasone (FLONASE) 50 MCG/ACT nasal spray Place 1 spray into both nostrils 2 (two) times daily. 16 g 3  . ipratropium (ATROVENT) 0.06 % nasal spray Place 1 spray into both nostrils 2 (two) times daily. 15 mL 3  . levothyroxine (SYNTHROID, LEVOTHROID) 88 MCG tablet Take 1 tablet (88 mcg total) by mouth daily before breakfast. Due for lab recheck when running low on medicine 60 tablet 0  . meloxicam (MOBIC) 15 MG tablet Take 1 tablet (15 mg total) by mouth daily as needed (sore throat). 30 tablet 0  . Misc Natural Products (ESTROVEN ENERGY) TABS 1 tablet daily 30 tablet 0  . omeprazole (PRILOSEC) 20 MG capsule Take 1 capsule (20 mg total) by mouth daily. 90 capsule 3  . progesterone (PROMETRIUM) 100 MG capsule TAKE 1 CAPSULE DAILY WITH ESTROGEN 90 capsule 0   No current facility-administered medications on file prior to visit.    Allergies  Allergen Reactions  . Bee Venom Anaphylaxis      Review of Systems:  Constitutional: No recent illness  HEENT: No  headache, no vision change  Cardiac: No  chest pain  Respiratory:  No  shortness of breath.   Gastrointestinal: No  abdominal pain, no change in bowel habits  Skin: No  Rash    Exam:  BP 137/79   Pulse 90   Wt 128 lb (58.1 kg)   BMI 21.30 kg/m   Constitutional: VS see above. General Appearance: alert, well-developed, well-nourished, NAD  Skin: warm, dry, intact.   Psychiatric: Normal judgment/insight. Normal mood and affect. Oriented x3.  GYN: No lesions/ulcers to external genitalia, mild redness but no polyp at urethra, significantly atrophic vaginal mucosa, mild greenish discharge, cervix normal without lesions, uterus not enlarged or tender, adnexa no  masses and nontender    Results for orders placed or performed in visit on 12/11/16 (from the past 24 hour(s))  Wet prep, genital     Status: Abnormal   Collection Time: 12/11/16  4:34 PM  Result Value Ref Range   Yeast Wet Prep HPF POC NONE SEEN NONE SEEN   Trich, Wet Prep NONE SEEN NONE SEEN   Clue Cells Wet Prep HPF POC FEW (A) NONE SEEN   WBC, Wet Prep HPF POC MOD (A) NONE SEEN   Narrative   Performed at:  [facility], [id number]                [place], [place] [id number]      ASSESSMENT/PLAN:   Urinary frequency  Atrophic vaginitis - Possibly causing urethral irritation resulting in frequency/urgency. Sudden onset of symptoms less likely for pure urgency issue. Urology referral if no improve - Plan: estradiol (ESTRACE VAGINAL) 0.1 MG/GM vaginal cream  Bacterial vaginitis - Not sure that this would really be contributing to her urinary frequency issues, but we'll go ahead and treat.  Vaginal discharge - Plan: Wet prep, genital      Follow-up plan: Return if symptoms worsen or fail to improve in 1 week, call us and we can place referral to urology.  Visit summary with medication list and pertinent instructions was printed for patient to review, alert [place] if any changes needed. All questions at time of visit were answered - patient instructed to contact office with any additional concerns. ER/RTC precautions were reviewed with the patient and understanding verbalized.

## 2016-12-12 ENCOUNTER — Other Ambulatory Visit: Payer: Self-pay | Admitting: Osteopathic Medicine

## 2016-12-12 ENCOUNTER — Other Ambulatory Visit: Payer: Self-pay

## 2016-12-12 DIAGNOSIS — N898 Other specified noninflammatory disorders of vagina: Secondary | ICD-10-CM | POA: Diagnosis not present

## 2016-12-12 DIAGNOSIS — Z113 Encounter for screening for infections with a predominantly sexual mode of transmission: Secondary | ICD-10-CM | POA: Diagnosis not present

## 2016-12-12 DIAGNOSIS — R300 Dysuria: Secondary | ICD-10-CM | POA: Diagnosis not present

## 2016-12-12 MED ORDER — METRONIDAZOLE 500 MG PO TABS: 500.0000 mg | ORAL_TABLET | Freq: Two times a day (BID) | ORAL | 0 refills | Status: DC

## 2016-12-12 NOTE — Telephone Encounter (Signed)
FLAGYL HAS BEEN REORDERED. Rhonda Cunningham,CMA

## 2016-12-12 NOTE — Addendum Note (Signed)
Addended by: Doree Albee on: 12/12/2016 03:21 PM   Modules accepted: Orders

## 2016-12-12 NOTE — Addendum Note (Signed)
Addended by: Doree Albee on: 12/12/2016 03:19 PM   Modules accepted: Orders

## 2016-12-13 LAB — GC/CHLAMYDIA PROBE AMP
CT PROBE, AMP APTIMA: NOT DETECTED
GC Probe RNA: NOT DETECTED

## 2016-12-14 LAB — URINE CULTURE: Organism ID, Bacteria: NO GROWTH

## 2016-12-18 ENCOUNTER — Ambulatory Visit (INDEPENDENT_AMBULATORY_CARE_PROVIDER_SITE_OTHER): Payer: Commercial Managed Care - HMO | Admitting: Osteopathic Medicine

## 2016-12-18 ENCOUNTER — Encounter: Payer: Self-pay | Admitting: Osteopathic Medicine

## 2016-12-18 ENCOUNTER — Ambulatory Visit (INDEPENDENT_AMBULATORY_CARE_PROVIDER_SITE_OTHER): Payer: Commercial Managed Care - HMO

## 2016-12-18 VITALS — BP 119/72 | HR 87 | Ht 65.0 in | Wt 128.0 lb

## 2016-12-18 DIAGNOSIS — R399 Unspecified symptoms and signs involving the genitourinary system: Secondary | ICD-10-CM | POA: Diagnosis not present

## 2016-12-18 DIAGNOSIS — M25511 Pain in right shoulder: Secondary | ICD-10-CM

## 2016-12-18 MED ORDER — MELOXICAM 15 MG PO TABS: 15.0000 mg | ORAL_TABLET | Freq: Every day | ORAL | 0 refills | Status: DC | PRN

## 2016-12-18 NOTE — Progress Notes (Signed)
HPI: Suzanne Patrick is a 71 y.o. female  who presents to [facility] today, 12/18/16,  for chief complaint of:  Chief Complaint  Patient presents with  . Follow-up     Urinary: Recent complaints of burning with urination, Ucx (-)x2, trial vaginal estrogen and treatment of BV. She now feels overall better but feels need to urinate with pressure and dribbling when she goes. Later on she will urinate with a full stream and it isn't uncomfortable. Occasional tiny bit of burning with dribbling urination.   Shoulder pain on R, new, no injury.    Past medical history, surgical history, social history and family history reviewed.  Patient Active Problem List   Diagnosis Date Noted  . Upper airway cough syndrome 10/17/2016  . Chronic allergic rhinitis 10/17/2016  . Gastroesophageal reflux disease 10/17/2016  . Screening for colon cancer 10/09/2016  . Globus sensation 09/02/2016  . Benign skin growth 08/23/2016  . Adnexal mass 04/23/2016  . 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 Memory problem 12/06/2015  . Chronic tonsillitis 11/02/2015  . 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 Hypothyroidism 04/01/2015  . Hyperlipidemia 04/01/2015  . Depression 04/01/2015  . Anemia 04/01/2015  . Compression fracture of L1 lumbar vertebra (HCC) 04/01/2015    Current medication list and allergy/intolerance information reviewed.   Current Outpatient Prescriptions on File Prior to Visit  Medication Sig Dispense Refill  . aspirin 81 MG EC tablet Take 81 mg by mouth.    Marland Kitchen atorvastatin (LIPITOR) 40 MG tablet Take 1 tablet (40 mg total) by mouth daily. 90 tablet 3  . cetirizine (ZYRTEC) 10 MG tablet Take 1 tablet (10 mg total) by mouth daily. 30 tablet 11  . ciprofloxacin (CIPRO) 500 MG tablet Take 1 tablet (500 mg total) by mouth 2 (two) times daily. For 10 days 20 tablet 0  . citalopram (CELEXA) 20 MG tablet Take 1 tablet (20 mg total) by mouth daily. To help with mood. 90  tablet 2  . donepezil (ARICEPT) 10 MG tablet Take 1 tablet (10 mg total) by mouth at bedtime. For Memory 90 tablet 1  . estradiol (ESTRACE VAGINAL) 0.1 MG/GM vaginal cream 0.5 grams of cream intravaginally administered daily at bedtime for one or two weeks, then reduce to twice weekly. 42.5 g 12  . estrogens, conjugated, (PREMARIN) 0.625 MG tablet Take 1 tablet (0.625 mg total) by mouth daily. Take along with progesterone pill. 30 tablet 2  . fluticasone (FLONASE) 50 MCG/ACT nasal spray Place 1 spray into both nostrils 2 (two) times daily. 16 g 3  . ipratropium (ATROVENT) 0.06 % nasal spray Place 1 spray into both nostrils 2 (two) times daily. 15 mL 3  . levothyroxine (SYNTHROID, LEVOTHROID) 88 MCG tablet Take 1 tablet (88 mcg total) by mouth daily before breakfast. Due for lab recheck when running low on medicine 60 tablet 0  . meloxicam (MOBIC) 15 MG tablet Take 1 tablet (15 mg total) by mouth daily as needed (sore throat). 30 tablet 0  . metroNIDAZOLE (FLAGYL) 500 MG tablet Take 1 tablet (500 mg total) by mouth 2 (two) times daily. For bacterial vaginosis 14 tablet 0  . Misc Natural Products (ESTROVEN ENERGY) TABS 1 tablet daily 30 tablet 0  . omeprazole (PRILOSEC) 20 MG capsule Take 1 capsule (20 mg total) by mouth daily. 90 capsule 3  . progesterone (PROMETRIUM) 100 MG capsule TAKE 1 CAPSULE DAILY WITH ESTROGEN 90 capsule 0   No current facility-administered medications on file prior to visit.  Allergies  Allergen Reactions  . Bee Venom Anaphylaxis      Review of Systems:  Constitutional: No recent illness  Cardiac: No  chest pain,   Respiratory:  No  shortness of breath.   Gastrointestinal: No  abdominal pain  Musculoskeletal: +new myalgia/arthralgia  Skin: No  Rash  Exam:  BP 119/72   Pulse 87   Ht 5\' 5"  (1.651 m)   Wt 128 lb (58.1 kg)   BMI 21.30 kg/m   Constitutional: VS see above. General Appearance: alert, well-developed, well-nourished, NAD  Neurological:  Normal balance/coordination. No tremor.  Skin: warm, dry, intact.   Psychiatric: Normal judgment/insight. Normal mood and affect. Oriented x3.    Recent Results (from the past 2160 hour(s))  POCT fasting CBG (manual entry)     Status: Abnormal   Collection Time: 10/02/16  3:28 PM  Result Value Ref Range   POCT Glucose ([facility]) 127 (A) 70 - 99 mg/dL  TSH     Status: Abnormal   Collection Time: 10/02/16  4:40 PM  Result Value Ref Range   TSH 0.02 (L) mIU/L    Comment:   Reference Range   > or = 20 Years  0.40-4.50   Pregnancy Range First trimester  0.26-2.66 Second trimester 0.55-2.73 Third trimester  0.43-2.91     COMPLETE METABOLIC PANEL WITH GFR     Status: Abnormal   Collection Time: 10/02/16  4:40 PM  Result Value Ref Range   Sodium 141 135 - 146 mmol/L   Potassium 3.7 3.5 - 5.3 mmol/L   Chloride 108 98 - 110 mmol/L   CO2 22 20 - 31 mmol/L   Glucose, Bld 98 65 - 99 mg/dL   BUN 22 7 - 25 mg/dL   Creat 0.81 0.60 - 0.93 mg/dL    Comment:   For patients > or = 72 years of age: The upper reference limit for Creatinine is approximately 13% higher for people identified as African-American.      Total Bilirubin 0.6 0.2 - 1.2 mg/dL   Alkaline Phosphatase 84 33 - 130 U/L   AST 31 10 - 35 U/L   ALT 33 (H) 6 - 29 U/L   Total Protein 6.3 6.1 - 8.1 g/dL   Albumin 4.1 3.6 - 5.1 g/dL   Calcium 9.6 8.6 - 10.4 mg/dL   GFR, Est African American 85 >=60 mL/min   GFR, Est Non African American 73 >=60 mL/min  CBC w auto diff ([facility])     Status: None   Collection Time: 10/02/16  4:53 PM  Result Value Ref Range   WBC  4.5 - 10.5 K/uL    Comment: see scanned report   Lymphocytes relative %  15 - 45 %   Monocytes relative %  2 - 10 %   Neutrophils relative % (GR)  44 - 76 %   Lymphocytes absolute  0.1 - 1.8 K/uL   Monocyes absolute  0.1 - 1 K/uL   Neutrophils absolute (GR#)  1.7 - 7.8 K/uL   RBC  3.8 - 5.1 MIL/uL   Hemoglobin  11.8 - 15.5 g/dL   Hematocrit  34.8 -  46 %   MCV  78 - 100 fL   MCH  26 - 32 pg   MCHC  32 - 36.5 g/dL   RDW  11.6 - 14 %   Platelet count  140 - 400 K/uL   MPV  7.8 - 11 fL  Cologuard     Status:  None   Collection Time: 11/07/16 12:00 AM  Result Value Ref Range   Cologuard Negative   Thyroid Panel With TSH     Status: Abnormal   Collection Time: 11/27/16  9:16 AM  Result Value Ref Range   T4, Total 7.0 4.5 - 12.0 ug/dL   T3 Uptake 28 22 - 35 %   Free Thyroxine Index 2.0 1.4 - 3.8   TSH 0.02 (L) mIU/L    Comment:   Reference Range   > or = 20 Years  0.40-4.50   Pregnancy Range First trimester  0.26-2.66 Second trimester 0.55-2.73 Third trimester  0.43-2.91     POCT Urinalysis Dipstick     Status: Abnormal   Collection Time: 12/07/16  8:23 AM  Result Value Ref Range   Color, UA yellow    Clarity, UA clear    Glucose, UA negative    Bilirubin, UA small    Ketones, UA trace    Spec Grav, UA 1.020    Blood, UA negative    pH, UA 5.5    Protein, UA negative    Urobilinogen, UA 1.0    Nitrite, UA negative    Leukocytes, UA Trace (A) Negative  Urine Culture     Status: None   Collection Time: 12/07/16  4:04 PM  Result Value Ref Range   Organism ID, Bacteria NO GROWTH   Wet prep, genital     Status: Abnormal   Collection Time: 12/11/16  4:34 PM  Result Value Ref Range   Yeast Wet Prep HPF POC NONE SEEN NONE SEEN   Trich, Wet Prep NONE SEEN NONE SEEN   Clue Cells Wet Prep HPF POC FEW (A) NONE SEEN   WBC, Wet Prep HPF POC MOD (A) NONE SEEN  Urine Culture     Status: None   Collection Time: 12/12/16  3:18 PM  Result Value Ref Range   Organism ID, Bacteria NO GROWTH   GC/Chlamydia Probe Amp     Status: None   Collection Time: 12/12/16  3:21 PM  Result Value Ref Range   CT Probe RNA NOT DETECTED     Comment:                    **Normal Reference Range: NOT DETECTED**   This test was performed using the APTIMA COMBO2 Assay ([employer]   The analytical performance characteristics of this assay,  when used to test SurePath specimens have been determined by [facility]      GC Probe RNA NOT DETECTED     Comment:                    **Normal Reference Range: NOT DETECTED**   This test was performed using the APTIMA COMBO2 Assay ([employer]   The analytical performance characteristics of this assay, when used to test SurePath specimens have been determined by [facility]       No results found.  No flowsheet data found.  Depression screen PHQ 2/9 08/31/2016  Decreased Interest 0  Down, Depressed, Hopeless 0  PHQ - 2 Score 0      ASSESSMENT/PLAN:   Lower urinary tract symptoms - Plan: Ambulatory referral to Urology  Acute pain of right shoulder - Plan: meloxicam (MOBIC) 15 MG tablet, DG Shoulder Right    Follow-up plan: Return in about 3 months (around 03/17/2017) for routine followup as directed previously .  Visit summary with medication list and pertinent instructions was printed for  patient to review, alert [place] if any changes needed. All questions at time of visit were answered - patient instructed to contact office with any additional concerns. ER/RTC precautions were reviewed with the patient and understanding verbalized.

## 2016-12-24 ENCOUNTER — Telehealth: Payer: Self-pay | Admitting: Physician Assistant

## 2016-12-24 ENCOUNTER — Telehealth: Payer: Self-pay | Admitting: Osteopathic Medicine

## 2016-12-24 MED ORDER — CITALOPRAM HYDROBROMIDE 20 MG PO TABS: 20.0000 mg | ORAL_TABLET | Freq: Every day | ORAL | 0 refills | Status: DC

## 2016-12-24 NOTE — Telephone Encounter (Signed)
Pt husband came in with a letter from [facility] saying that they were not able to refill Suzanne Patrick's Citalopram because they didn't get a response from the provider. They would like to get this refilled and sent to [facility] if possible. Thanks

## 2016-12-24 NOTE — Addendum Note (Signed)
Addended by: Maryla Morrow on: 12/24/2016 03:08 PM   Modules accepted: Orders

## 2016-12-24 NOTE — Telephone Encounter (Signed)
Refill sent for 90 day supply, she has been on this medication for some time so I'm ok to refill it. She'd due to followup anyway in May of this year so will address this further at that time

## 2016-12-24 NOTE — Telephone Encounter (Signed)
Dr. Sheppard Coil please advise if this patient needs appointment for depression. She is requesting a refill for Citalopram and the last time she wass seen for this looks like in 2016. Please advise. Shaniquia Brafford,CMA

## 2016-12-24 NOTE — Telephone Encounter (Signed)
This is an [facility] pt.

## 2016-12-24 NOTE — Telephone Encounter (Signed)
Pt husband came in and stated that his wife needs a refill on her Celexa. Thanks

## 2017-01-17 ENCOUNTER — Other Ambulatory Visit: Payer: Self-pay

## 2017-01-17 DIAGNOSIS — E039 Hypothyroidism, unspecified: Secondary | ICD-10-CM

## 2017-01-25 DIAGNOSIS — E039 Hypothyroidism, unspecified: Secondary | ICD-10-CM | POA: Diagnosis not present

## 2017-01-26 LAB — THYROID PANEL WITH TSH
FREE THYROXINE INDEX: 2.4 (ref 1.4–3.8)
T3 UPTAKE: 29 % (ref 22–35)
T4 TOTAL: 8.4 ug/dL (ref 4.5–12.0)
TSH: 0.02 m[IU]/L — AB

## 2017-01-28 ENCOUNTER — Other Ambulatory Visit: Payer: Self-pay | Admitting: Osteopathic Medicine

## 2017-01-28 DIAGNOSIS — R3915 Urgency of urination: Secondary | ICD-10-CM | POA: Diagnosis not present

## 2017-01-28 DIAGNOSIS — E039 Hypothyroidism, unspecified: Secondary | ICD-10-CM

## 2017-01-28 MED ORDER — LEVOTHYROXINE SODIUM 75 MCG PO TABS: 75.0000 ug | ORAL_TABLET | Freq: Every day | ORAL | 0 refills | Status: DC

## 2017-01-28 NOTE — Progress Notes (Signed)
Please call patient: Thyroid labs indicate her dose of medication is still a bit too high. I have called in the 75 g dose to replace the 88 g. We'll plan to recheck levels in another 2 months.

## 2017-01-29 NOTE — Progress Notes (Signed)
Patient has been informed. Kendal Ghazarian,CMA  

## 2017-01-30 ENCOUNTER — Encounter: Payer: Self-pay | Admitting: Osteopathic Medicine

## 2017-01-30 DIAGNOSIS — R3915 Urgency of urination: Secondary | ICD-10-CM | POA: Insufficient documentation

## 2017-01-31 ENCOUNTER — Ambulatory Visit (INDEPENDENT_AMBULATORY_CARE_PROVIDER_SITE_OTHER): Payer: Commercial Managed Care - HMO | Admitting: Osteopathic Medicine

## 2017-01-31 ENCOUNTER — Encounter: Payer: Self-pay | Admitting: Osteopathic Medicine

## 2017-01-31 VITALS — BP 131/78 | HR 91 | Wt 123.0 lb

## 2017-01-31 DIAGNOSIS — E039 Hypothyroidism, unspecified: Secondary | ICD-10-CM

## 2017-01-31 DIAGNOSIS — R399 Unspecified symptoms and signs involving the genitourinary system: Secondary | ICD-10-CM | POA: Diagnosis not present

## 2017-01-31 DIAGNOSIS — R413 Other amnesia: Secondary | ICD-10-CM

## 2017-01-31 MED ORDER — ESTRADIOL 0.1 MG/GM VA CREA: 1.0000 | TOPICAL_CREAM | Freq: Every day | VAGINAL | 12 refills | Status: DC

## 2017-01-31 MED ORDER — LEVOTHYROXINE SODIUM 88 MCG PO TABS: 88.0000 ug | ORAL_TABLET | Freq: Every day | ORAL | 1 refills | Status: DC

## 2017-01-31 NOTE — Progress Notes (Signed)
HPI: Suzanne Patrick is a 71 y.o. female  who presents to [facility] today, 01/31/17,  for chief complaint of:  Chief Complaint  Patient presents with  . Follow-up    bloodwork    Hypothyroid: Patient never stopped taking the Cytomel, TSH has been below normal range for several occasions now. There seemed to be some confusion about her medication regimen.  Memory: Patient and husband are a bit concerned about her memory issues. Husband states that at some point here she had been tested for memory issues but this was normal. Looks like this was addressed at an office visit with Dr. Ileene Rubens 12/06/2015. Normal MMSE.  Urinary symptoms: Recently seen by urology. They advised vaginal estrogen, patient is not sure whether she was taking this in the past. When I described vaginal estrogen applications she states that she's not sure this was ever something that she did, she is on some kind of over-the-counter menopausal supplement. Having some burning with urination with dribbling stream but with a strong stream she doesn't have any discomfort. Cystoscopy was discussed with urology but patient declined at this time    Past medical history, surgical history, social history and family history reviewed.  Patient Active Problem List   Diagnosis Date Noted  . Urinary urgency 01/30/2017  . Upper airway cough syndrome 10/17/2016  . Chronic allergic rhinitis 10/17/2016  . Gastroesophageal reflux disease 10/17/2016  . Screening for colon cancer 10/09/2016  . Globus sensation 09/02/2016  . Benign skin growth 08/23/2016  . Adnexal mass 04/23/2016  . 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 Memory problem 12/06/2015  . Chronic tonsillitis 11/02/2015  . 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 Hypothyroidism 04/01/2015  . Hyperlipidemia 04/01/2015  . Depression 04/01/2015  . Anemia 04/01/2015  . Compression fracture of L1 lumbar vertebra (HCC) 04/01/2015    Current medication  list and allergy/intolerance information reviewed.   Current Outpatient Prescriptions on File Prior to Visit  Medication Sig Dispense Refill  . aspirin 81 MG EC tablet Take 81 mg by mouth.    Marland Kitchen atorvastatin (LIPITOR) 40 MG tablet Take 1 tablet (40 mg total) by mouth daily. 90 tablet 3  . citalopram (CELEXA) 20 MG tablet Take 1 tablet (20 mg total) by mouth daily. To help with mood. 90 tablet 0  . levothyroxine (SYNTHROID, LEVOTHROID) 75 MCG tablet Take 1 tablet (75 mcg total) by mouth daily before breakfast. Due for lab recheck when running low on medicine 60 tablet 0  . liothyronine (CYTOMEL) 25 MCG tablet Take 25 mcg by mouth daily.  0  . meloxicam (MOBIC) 15 MG tablet Take 1 tablet (15 mg total) by mouth daily as needed (sore throat). 30 tablet 0  . omeprazole (PRILOSEC) 20 MG capsule Take 1 capsule (20 mg total) by mouth daily. 90 capsule 3   No current facility-administered medications on file prior to visit.    Allergies  Allergen Reactions  . Bee Venom Anaphylaxis      Review of Systems:  Constitutional: No recent illness  Cardiac: No  chest pain  Respiratory:  No  shortness of breath. No  Cough  Gastrointestinal: No  abdominal pain  Musculoskeletal: No new myalgia/arthralgia  Neurologic: No  weakness, No  Dizziness  Psychiatric: No  concerns with depression  Exam:  BP 131/78   Pulse 91   Wt 123 lb (55.8 kg)   BMI 20.47 kg/m   Constitutional: VS see above. General Appearance: alert, well-developed, well-nourished, NAD  Eyes: Normal lids and  conjunctive, non-icteric sclera  Ears, Nose, Mouth, Throat: MMM, Normal external inspection ears/nares/mouth/lips/gums.  Neck: No masses, trachea midline.   Respiratory: Normal respiratory effort. no wheeze, no rhonchi, no rales  Cardiovascular: S1/S2 normal, no murmur, no rub/gallop auscultated. RRR.   Musculoskeletal: Gait normal. Symmetric and independent movement of all extremities  Neurological: Normal  balance/coordination. No tremor.  Skin: warm, dry, intact.   Psychiatric: Normal judgment/insight. Normal mood and affect. Oriented x3.      ASSESSMENT/PLAN:   Hypothyroidism, unspecified type - Just stay on levothyroxine only for hypothyroidism treatment. Recheck levels in 2 months - Plan: levothyroxine (SYNTHROID, LEVOTHROID) 88 MCG tablet  Lower urinary tract symptoms - If persistent despite treatment with vaginal estrogen, patient advised to follow-up with urology - Plan: estradiol (ESTRACE VAGINAL) 0.1 MG/GM vaginal cream  Memory problem - Stable. Advised can come back for full cognitive testing.    Patient Instructions  Plan: Recheck labs in 2 months Annual physical in 6 months  Come see me sooner if needed!      Follow-up plan: Return in about 6 months (around 08/03/2017) for ANNUAL PHYSICAL - sooner if needed .  Visit summary with medication list and pertinent instructions was printed for patient to review, alert [place] if any changes needed. All questions at time of visit were answered - patient instructed to contact office with any additional concerns. ER/RTC precautions were reviewed with the patient and understanding verbalized.

## 2017-01-31 NOTE — Patient Instructions (Addendum)
Plan: Recheck labs in 2 months Annual physical in 6 months  Come see me sooner if needed!

## 2017-02-11 ENCOUNTER — Telehealth: Payer: Self-pay | Admitting: Osteopathic Medicine

## 2017-02-11 ENCOUNTER — Other Ambulatory Visit: Payer: Self-pay | Admitting: Osteopathic Medicine

## 2017-02-11 NOTE — Telephone Encounter (Signed)
Pt is requesting her Thyroid medicine called in to [facility] on [address] in [place] and they only requested 1 weeks worth. Thanks

## 2017-02-12 NOTE — Telephone Encounter (Signed)
90 tablets refill was sent to [facility] on 01/31/17 - is this okay or does she need more at a local pharmacy

## 2017-02-13 ENCOUNTER — Other Ambulatory Visit: Payer: Self-pay

## 2017-02-13 DIAGNOSIS — E039 Hypothyroidism, unspecified: Secondary | ICD-10-CM

## 2017-04-01 ENCOUNTER — Other Ambulatory Visit: Payer: Self-pay

## 2017-04-01 MED ORDER — CITALOPRAM HYDROBROMIDE 20 MG PO TABS: 20.0000 mg | ORAL_TABLET | Freq: Every day | ORAL | 0 refills | Status: DC

## 2017-04-10 DIAGNOSIS — E039 Hypothyroidism, unspecified: Secondary | ICD-10-CM | POA: Diagnosis not present

## 2017-04-11 LAB — TSH: TSH: 0.15 m[IU]/L — AB

## 2017-04-22 DIAGNOSIS — E079 Disorder of thyroid, unspecified: Secondary | ICD-10-CM | POA: Diagnosis not present

## 2017-04-22 DIAGNOSIS — Z7982 Long term (current) use of aspirin: Secondary | ICD-10-CM | POA: Diagnosis not present

## 2017-04-22 DIAGNOSIS — Z79899 Other long term (current) drug therapy: Secondary | ICD-10-CM | POA: Diagnosis not present

## 2017-04-22 DIAGNOSIS — R079 Chest pain, unspecified: Secondary | ICD-10-CM | POA: Diagnosis not present

## 2017-04-22 DIAGNOSIS — Z791 Long term (current) use of non-steroidal anti-inflammatories (NSAID): Secondary | ICD-10-CM | POA: Diagnosis not present

## 2017-04-22 DIAGNOSIS — Z91030 Bee allergy status: Secondary | ICD-10-CM | POA: Diagnosis not present

## 2017-04-22 DIAGNOSIS — J9811 Atelectasis: Secondary | ICD-10-CM | POA: Diagnosis not present

## 2017-04-22 DIAGNOSIS — R0789 Other chest pain: Secondary | ICD-10-CM | POA: Diagnosis not present

## 2017-04-22 DIAGNOSIS — K449 Diaphragmatic hernia without obstruction or gangrene: Secondary | ICD-10-CM | POA: Diagnosis not present

## 2017-04-25 ENCOUNTER — Telehealth: Payer: Self-pay

## 2017-04-25 DIAGNOSIS — E039 Hypothyroidism, unspecified: Secondary | ICD-10-CM

## 2017-04-25 MED ORDER — LEVOTHYROXINE SODIUM 50 MCG PO TABS: 50.0000 ug | ORAL_TABLET | Freq: Every day | ORAL | 0 refills | Status: DC

## 2017-04-25 NOTE — Telephone Encounter (Signed)
Patient spouse called in reference to patient lab results, he stated that patient is not taking Cymotel. She is only taking Levothyroxine 88 mcg. He stated that if a lower dose be called in to please send it to [facility] mail [facility]. Please advise. SEE LAB NOTE BELOW. Oneta Rack   TSH  Order: [id number]  Status:  Final result Visible to patient:  No (Not Released) Next appt:  None Dx:  Hypothyroidism, unspecified type  Notes recorded by Doree Albee, CMA on 04/11/2017 at 4:00 PM EDT Called patient for the 3rd time to give results but no answer. Will send result letter. Dannis Deroche,CMA  ------  Notes recorded by Doree Albee, CMA on 04/11/2017 at 7:41 AM EDT Called patient could not leave a message because no vm came on will try back later. Lunden Stieber,CMA  ------  Notes recorded by Emeterio Reeve, DO on 04/11/2017 at 7:15 AM EDT Thyroid levels still indicate that her dose of thyroid medication is too high. Please confirm that she is not taking the Cytomel (aka liothyronine) and that she is only taking levothyroxine 88 g once per day. If this is the regimen that she is using, we will need to decrease the dose of levothyroxine and recheck again in 2 months    Ref Range & Units 2wk ago 2mo ago   TSH mIU/L 0.15   0.02CM    Comment:   Reference Range    > or = 20 Years 0.40-4.50    Pregnancy Range  First trimester 0.26-2.66  Second trimester 0.55-2.73  Third trimester 0.43-2.91     Resulting Agency  [facility]  Narrative   Performed at: [address], [address]        [place], [place] [address] SOURCE: [place]    Specimen Collected: 04/10/17 10:40 Last Resulted: 04/11/17 01:36            CM=Additional comments        Result Notes   Notes recorded by Doree Albee, CMA on 04/11/2017 at 4:00 PM EDT Called patient for the 3rd time to give results but no answer. Will send  result letter. Bridget ,CMA  ------  Notes recorded by Doree Albee, CMA on 04/11/2017 at 7:41 AM EDT Called patient could not leave a message because no vm came on will try back later. Kynadi Dragos,CMA  ------  Notes recorded by Emeterio Reeve, DO on 04/11/2017 at 7:15 AM EDT Thyroid levels still indicate that her dose of thyroid medication is too high. Please confirm that she is not taking the Cytomel (aka liothyronine) and that she is only taking levothyroxine 88 g once per day. If this is the regimen that she is using, we will need to decrease the dose of levothyroxine and recheck again in 2 months      Reviewed By List   Doree Albee, CMA on 04/11/2017 16:00  Emeterio Reeve, DO on 04/11/2017 07:15

## 2017-04-25 NOTE — Telephone Encounter (Signed)
PATIENT SPOUSE INFORMED THAT NEW  RX WAS SENT TO MAIL ORDER. Raymie Giammarco,CMA

## 2017-04-25 NOTE — Telephone Encounter (Signed)
Medications sent to pharmacy

## 2017-05-10 ENCOUNTER — Encounter: Payer: Self-pay | Admitting: Osteopathic Medicine

## 2017-05-10 ENCOUNTER — Ambulatory Visit (INDEPENDENT_AMBULATORY_CARE_PROVIDER_SITE_OTHER): Payer: Commercial Managed Care - HMO | Admitting: Osteopathic Medicine

## 2017-05-10 VITALS — BP 115/66 | HR 80 | Ht 64.75 in | Wt 125.3 lb

## 2017-05-10 DIAGNOSIS — R4189 Other symptoms and signs involving cognitive functions and awareness: Secondary | ICD-10-CM | POA: Diagnosis not present

## 2017-05-10 MED ORDER — DONEPEZIL HCL 5 MG PO TABS: 5.0000 mg | ORAL_TABLET | Freq: Every day | ORAL | 2 refills | Status: DC

## 2017-05-10 NOTE — Progress Notes (Signed)
HPI: Suzanne Patrick is a 71 y.o. female  who presents to [facility] today, 05/10/17,  for chief complaint of:  Chief Complaint  Patient presents with  . Memory Loss    Patient and husband are concerned about her memory. She is often going into rooms and forgetting why she went there, can't remember to do anything unless she puts it on a list. Has noticed some difficulty with balance here and there but no significant weakness or falls. Trouble remembering what day it is, where she puts things. She has some unusual concerns as well about having an out for planned to wear but then putting on something else that she didn't really think she was going to put on. Has some concerns about how her daughter seems to be more worried about her. Has been corroborate some of this, he is worried that she is fairly sedentary    Past medical history, surgical history, social history and family history reviewed.  Patient Active Problem List   Diagnosis Date Noted  . Urinary urgency 01/30/2017  . Upper airway cough syndrome 10/17/2016  . Chronic allergic rhinitis 10/17/2016  . Gastroesophageal reflux disease 10/17/2016  . Screening for colon cancer 10/09/2016  . Globus sensation 09/02/2016  . Benign skin growth 08/23/2016  . Adnexal mass 04/23/2016  . 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 Memory problem 12/06/2015  . Chronic tonsillitis 11/02/2015  . 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 Hypothyroidism 04/01/2015  . Hyperlipidemia 04/01/2015  . Depression 04/01/2015  . Anemia 04/01/2015  . Compression fracture of L1 lumbar vertebra (HCC) 04/01/2015    Current medication list and allergy/intolerance information reviewed.   Current Outpatient Prescriptions on File Prior to Visit  Medication Sig Dispense Refill  . aspirin 81 MG EC tablet Take 81 mg by mouth.    Marland Kitchen atorvastatin (LIPITOR) 40 MG tablet Take 1 tablet (40 mg total) by mouth daily. 90 tablet 3  . citalopram  (CELEXA) 20 MG tablet Take 1 tablet (20 mg total) by mouth daily. To help with mood. 90 tablet 0  . estradiol (ESTRACE VAGINAL) 0.1 MG/GM vaginal cream Place 1 Applicatorful vaginally at bedtime. For 1-2 weeks, after that use twice per week 42.5 g 12  . levothyroxine (SYNTHROID, LEVOTHROID) 50 MCG tablet Take 1 tablet (50 mcg total) by mouth daily before breakfast. Will be due for recheck labs when pills are running low 60 tablet 0  . meloxicam (MOBIC) 15 MG tablet Take 1 tablet (15 mg total) by mouth daily as needed (sore throat). 30 tablet 0  . omeprazole (PRILOSEC) 20 MG capsule Take 1 capsule (20 mg total) by mouth daily. 90 capsule 3   No current facility-administered medications on file prior to visit.    Allergies  Allergen Reactions  . Bee Venom Anaphylaxis      Review of Systems:  Constitutional: No recent illness  HEENT: No  headache, no vision change  Cardiac: No  chest pain, No  pressure, No palpitations  Respiratory:  No  shortness of breath. No  Cough  Musculoskeletal: No new myalgia/arthralgia  Skin: No  Rash  Neurologic: +Occasional generalized weakness, No  Dizziness  Psychiatric: No  concerns with depression, No  concerns with anxiety  Exam:  BP 115/66   Pulse 80   Ht 5' 4.75" (1.645 m)   Wt 125 lb 4.8 oz (56.8 kg)   SpO2 100%   BMI 21.01 kg/m   Constitutional: VS see above. General Appearance: alert, well-developed, well-nourished, NAD  Eyes: Normal lids and conjunctive, non-icteric sclera  Ears, Nose, Mouth, Throat: MMM, Normal external inspection ears/nares/mouth/lips/gums.  Neck: No masses, trachea midline.   Respiratory: Normal respiratory effort.   Musculoskeletal: Gait normal. Symmetric and independent movement of all extremities  Neurological: Normal balance/coordination. No tremor.  Psychiatric: Fair judgment/insight. Normal mood and affect. Oriented x3.    Recent Results (from the past 2160 hour(s))  TSH     Status: Abnormal    Collection Time: 04/10/17 10:40 AM  Result Value Ref Range   TSH 0.15 (L) mIU/L    Comment:   Reference Range   > or = 20 Years  0.40-4.50   Pregnancy Range First trimester  0.26-2.66 Second trimester 0.55-2.73 Third trimester  0.43-2.91       No results found.  No flowsheet data found.  Depression screen [facility] 2/9 01/31/2017 08/31/2016  Decreased Interest 3 0  Down, Depressed, Hopeless 3 0  PHQ - 2 Score 6 0  Altered sleeping 2 -  Tired, decreased energy 3 -  Change in appetite 3 -  Feeling bad or failure about yourself  3 -  Trouble concentrating 2 -  Moving slowly or fidgety/restless 2 -  Suicidal thoughts 1 -  PHQ-9 Score 22 -    MMSE - Mini Mental State Exam 05/10/2017  Orientation to time 3  Orientation to Place 3  Registration 3  Attention/ Calculation 0  Recall 3  Language- name 2 objects 2  Language- repeat 0  Language- follow 3 step command 3  Language- read & follow direction 1  Write a sentence 1  Copy design 1  Total score 20     ASSESSMENT/PLAN: Advised at this point I don't think cognitive deficits are severe enough to warrant the diagnosis of dementia, patient and her husband would like to try medication to hopefully slow cognitive decline. I also advised optimization of medication regimen: Ensure appropriate dose of levothyroxine (will need follow-up TSH levels in about 6 weeks), continue aspirin, discontinue PPI. Advised increased physical activity. Recheck in 4 weeks   Cognitive impairment - Plan: donepezil (ARICEPT) 5 MG tablet     Follow-up plan: Return in about 4 weeks (around 06/07/2017) for RECHECK ON [place] .  Visit summary with medication list and pertinent instructions was printed for patient to review, alert [place] if any changes needed. All questions at time of visit were answered - patient instructed to contact office with any additional concerns. ER/RTC precautions were reviewed with the patient and understanding  verbalized.   Note: Total time spent 25 minutes, greater than 50% of the visit was spent face-to-face counseling and coordinating care for the following: The encounter diagnosis was Cognitive impairment.Marland Kitchen

## 2017-05-10 NOTE — Addendum Note (Signed)
Addended by: Maryla Morrow on: 05/10/2017 03:49 PM   Modules accepted: Orders

## 2017-05-30 LAB — LIPID PANEL
Cholesterol: 132 (ref 0–200)
HDL: 45 (ref 35–70)
LDL Cholesterol: 63
Triglycerides: 122 (ref 40–160)

## 2017-05-30 LAB — CBC AND DIFFERENTIAL
HCT: 38 (ref 36–46)
HEMOGLOBIN: 13.1 (ref 12.0–16.0)
Platelets: 201 (ref 150–399)
WBC: 6.1

## 2017-05-30 LAB — BASIC METABOLIC PANEL
Albumin: 4.4
BUN/Creatinine Ratio: 21
BUN: 19 (ref 4–21)
Chloride: 102
Creatinine: 0.9 (ref 0.5–1.1)
EGFR (Non-African Amer.): 64
GLUCOSE: 85
POTASSIUM: 3.9 (ref 3.4–5.3)
SODIUM: 140 (ref 137–147)

## 2017-05-30 LAB — HEMOGLOBIN A1C: HEMOGLOBIN A1C: 5.3

## 2017-05-30 LAB — HEPATIC FUNCTION PANEL
ALT: 29 (ref 7–35)
AST: 32 (ref 13–35)

## 2017-06-06 ENCOUNTER — Telehealth: Payer: Self-pay | Admitting: Osteopathic Medicine

## 2017-06-06 NOTE — Telephone Encounter (Signed)
Cell was a mans voiced names robert caldwell; no message was left; home # rang but no answer and no vm

## 2017-06-06 NOTE — Telephone Encounter (Signed)
Received lab results from [place] study - no major concerns other than elevated CRP which is a nonspecific indicator of inflammation. If she has concerns or questions, she should schedule an appointment or speak to whoever ordered the original labs. (clinical study coordinator signed the letter but I can't read the name, phone is [phone])

## 2017-06-07 ENCOUNTER — Encounter: Payer: Self-pay | Admitting: Osteopathic Medicine

## 2017-06-07 LAB — INSULIN, RANDOM: INSULIN: 7.2

## 2017-06-07 LAB — CHG C-REACTIVE PROTEIN: CRP MG/L: 17.1

## 2017-06-07 LAB — DHEA-SULFATE: DHEA-Sulfate, Serum: 64.7

## 2017-06-07 LAB — CORTISOL - AM: CORTISOL - AM: 7.3

## 2017-06-07 LAB — FIBRINOGEN: FIBRINOGEN: 383

## 2017-06-08 NOTE — Telephone Encounter (Signed)
Letter was sent

## 2017-06-27 ENCOUNTER — Other Ambulatory Visit: Payer: Self-pay | Admitting: Osteopathic Medicine

## 2017-06-27 ENCOUNTER — Other Ambulatory Visit: Payer: Self-pay | Admitting: *Deleted

## 2017-06-28 DIAGNOSIS — E039 Hypothyroidism, unspecified: Secondary | ICD-10-CM | POA: Diagnosis not present

## 2017-06-29 LAB — TSH: TSH: 2.22 mIU/L

## 2017-07-02 ENCOUNTER — Other Ambulatory Visit: Payer: Self-pay | Admitting: Osteopathic Medicine

## 2017-07-02 DIAGNOSIS — E039 Hypothyroidism, unspecified: Secondary | ICD-10-CM

## 2017-07-03 ENCOUNTER — Other Ambulatory Visit: Payer: Self-pay

## 2017-07-03 ENCOUNTER — Telehealth: Payer: Self-pay | Admitting: *Deleted

## 2017-07-03 MED ORDER — CITALOPRAM HYDROBROMIDE 20 MG PO TABS: ORAL_TABLET | ORAL | 3 refills | Status: DC

## 2017-07-03 NOTE — Progress Notes (Signed)
Attempted to call pt with lab results from Dr Alexander. No answer and no VM.  

## 2017-07-03 NOTE — Telephone Encounter (Signed)
[employer] request a new Rx for Citalopram. # 90 3 refills were approved by PCP. Rhonda Cunningham,CMA

## 2017-07-03 NOTE — Telephone Encounter (Signed)
Attempted to call pt with lab results from Dr Sheppard Coil. No answer and no VM.

## 2017-07-19 ENCOUNTER — Other Ambulatory Visit: Payer: Self-pay | Admitting: Osteopathic Medicine

## 2017-07-19 DIAGNOSIS — R4189 Other symptoms and signs involving cognitive functions and awareness: Secondary | ICD-10-CM

## 2017-07-23 ENCOUNTER — Encounter: Payer: Self-pay | Admitting: Osteopathic Medicine

## 2017-07-23 ENCOUNTER — Ambulatory Visit (INDEPENDENT_AMBULATORY_CARE_PROVIDER_SITE_OTHER): Payer: Medicare HMO | Admitting: Osteopathic Medicine

## 2017-07-23 VITALS — BP 120/62 | HR 84 | Wt 128.0 lb

## 2017-07-23 DIAGNOSIS — R4189 Other symptoms and signs involving cognitive functions and awareness: Secondary | ICD-10-CM | POA: Diagnosis not present

## 2017-07-23 DIAGNOSIS — E039 Hypothyroidism, unspecified: Secondary | ICD-10-CM | POA: Diagnosis not present

## 2017-07-23 DIAGNOSIS — Z23 Encounter for immunization: Secondary | ICD-10-CM | POA: Diagnosis not present

## 2017-07-23 MED ORDER — LEVOTHYROXINE SODIUM 50 MCG PO TABS
50.0000 ug | ORAL_TABLET | Freq: Every day | ORAL | 3 refills | Status: DC
Start: 2017-07-23 — End: 2018-06-27

## 2017-07-23 MED ORDER — DONEPEZIL HCL 5 MG PO TABS: 5.0000 mg | ORAL_TABLET | Freq: Every day | ORAL | 3 refills | Status: DC

## 2017-07-23 NOTE — Progress Notes (Signed)
HPI: Suzanne Patrick is a 71 y.o. female  who presents to [facility] today, 07/23/17,  for chief complaint of:  Chief Complaint  Patient presents with  . Follow-up    thyroid    Was told she needed follow-up visit for thyroid medication refills. Per my last result know when labs were checked in August, refills were appropriate. She did not need to schedule a visit today. We'll go ahead and give flu shot but will not charge.  Past medical history, surgical history, social history and family history reviewed.    Current medication list and allergy/intolerance information reviewed.   Current Outpatient Prescriptions on File Prior to Visit  Medication Sig Dispense Refill  . aspirin 81 MG EC tablet Take 81 mg by mouth.    Marland Kitchen atorvastatin (LIPITOR) 40 MG tablet Take 1 tablet (40 mg total) by mouth daily. 90 tablet 3  . citalopram (CELEXA) 20 MG tablet Take one tablet daily to help with mood 90 tablet 3  . donepezil (ARICEPT) 5 MG tablet TAKE 1 TABLET AT BEDTIME 90 tablet 2  . levothyroxine (SYNTHROID, LEVOTHROID) 50 MCG tablet Take 1 tablet (50 mcg total) by mouth daily before breakfast. APPOINTMENT NEEDED FOR FURTHER REFILLS 30 tablet 0  . Multiple Vitamins-Minerals (WOMENS MULTIVITAMIN PO) Take 1 tablet by mouth daily.     No current facility-administered medications on file prior to visit.    Allergies  Allergen Reactions  . Bee Venom Anaphylaxis      Review of Systems:  Constitutional: No recent illness, feels well today   Exam:  BP 120/62   Pulse 84   Wt 128 lb (58.1 kg)   BMI 21.47 kg/m   Constitutional: VS see above. General Appearance: alert, well-developed, well-nourished, NAD  Psychiatric: Normal judgment/insight. Normal mood and affect. Oriented x3.      ASSESSMENT/PLAN:   Hypothyroidism, unspecified type - Just stay on levothyroxine only for hypothyroidism treatment. Recheck thyroid levels at her followup  - Plan:  levothyroxine (SYNTHROID, LEVOTHROID) 50 MCG tablet  Need for immunization against influenza - Plan: Flu vaccine HIGH DOSE PF  Cognitive impairment - Plan: donepezil (ARICEPT) 5 MG tablet      Follow-up plan: Return in about 8 months (around 03/22/2018) for [name] .  Visit summary with medication list and pertinent instructions was printed for patient to review, alert [place] if any changes needed. All questions at time of visit were answered - patient instructed to contact office with any additional concerns. ER/RTC precautions were reviewed with the patient and understanding verbalized.

## 2017-09-21 LAB — HEPATIC FUNCTION PANEL
ALK PHOS: 83 (ref 25–125)
ALT: 28 (ref 7–35)
AST: 33 (ref 13–35)
Bilirubin, Total: 0.9

## 2017-09-21 LAB — CBC AND DIFFERENTIAL
HEMOGLOBIN: 13.9 (ref 12.0–16.0)
PLATELETS: 188 (ref 150–399)
WBC: 5.8

## 2017-09-21 LAB — LIPID PANEL
Cholesterol: 174 (ref 0–200)
HDL: 45 (ref 35–70)
LDL CALC: 90
TRIGLYCERIDES: 196 — AB (ref 40–160)

## 2017-09-21 LAB — BASIC METABOLIC PANEL
BUN: 20 (ref 4–21)
Creatinine: 0.9 (ref 0.5–1.1)
GLUCOSE: 94
Potassium: 4.3 (ref 3.4–5.3)
SODIUM: 138 (ref 137–147)

## 2017-09-21 LAB — C-REACTIVE PROTEIN, QUANT: CRP: 0.5

## 2017-09-21 LAB — CORTISOL - AM: Cortisol - AM: 5.7

## 2017-09-21 LAB — HEMOGLOBIN A1C: Hemoglobin A1C: 5.3

## 2017-10-07 ENCOUNTER — Other Ambulatory Visit: Payer: Self-pay | Admitting: Osteopathic Medicine

## 2017-11-25 ENCOUNTER — Encounter: Payer: Self-pay | Admitting: Osteopathic Medicine

## 2017-12-05 ENCOUNTER — Encounter: Payer: Self-pay | Admitting: Osteopathic Medicine

## 2017-12-05 ENCOUNTER — Ambulatory Visit (INDEPENDENT_AMBULATORY_CARE_PROVIDER_SITE_OTHER): Payer: Medicare HMO | Admitting: Osteopathic Medicine

## 2017-12-05 VITALS — BP 117/75 | HR 83 | Wt 131.0 lb

## 2017-12-05 DIAGNOSIS — E039 Hypothyroidism, unspecified: Secondary | ICD-10-CM | POA: Diagnosis not present

## 2017-12-05 DIAGNOSIS — R5383 Other fatigue: Secondary | ICD-10-CM

## 2017-12-05 NOTE — Progress Notes (Signed)
HPI: Suzanne Patrick is a 72 y.o. female who  has no past medical history on file.  she presents to [facility] today, 12/05/17, for chief complaint of: Daytime somnolence, episodes of falling asleep   . Falling asleep in the middle of the day fairly abruptly, no LOC, she is rousable but intensely fatigued. No dizziness, no SOB. Happens when sitting down, once on cough, once at a table.  . No known triggers, maybe d/t low appetite and low Glc?  . 3 episodes over the past 3 weeks  Sleeping more at night, too, sometimes 12+ hours per day    Patient is accompanied by husband who assists with history-taking.   Past medical, surgical, social and family history reviewed:  Patient Active Problem List   Diagnosis Date Noted  . Urinary urgency 01/30/2017  . Upper airway cough syndrome 10/17/2016  . Chronic allergic rhinitis 10/17/2016  . Gastroesophageal reflux disease 10/17/2016  . Screening for colon cancer 10/09/2016  . Globus sensation 09/02/2016  . Benign skin growth 08/23/2016  . Adnexal mass 04/23/2016  . 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 Memory problem 12/06/2015  . Chronic tonsillitis 11/02/2015  . 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 Hypothyroidism 04/01/2015  . Hyperlipidemia 04/01/2015  . Depression 04/01/2015  . Anemia 04/01/2015  . Compression fracture of L1 lumbar vertebra ([place]) 04/01/2015    Past Surgical History:  Procedure Laterality Date  . BACK SURGERY    . TUBAL LIGATION      Social History   Tobacco Use  . Smoking status: Never Smoker  . Smokeless tobacco: Never Used  Substance Use Topics  . Alcohol use: Yes    Alcohol/week: 1.8 oz    Types: 3 Standard drinks or equivalent per week    Family History  Problem Relation Age of Onset  . Heart attack Father   . Hyperlipidemia Mother      Current medication list and allergy/intolerance information reviewed:    Current Outpatient Medications  Medication Sig  Dispense Refill  . aspirin 81 MG EC tablet Take 81 mg by mouth.    Marland Kitchen atorvastatin (LIPITOR) 40 MG tablet TAKE 1 TABLET EVERY DAY 90 tablet 3  . citalopram (CELEXA) 20 MG tablet Take one tablet daily to help with mood 90 tablet 3  . donepezil (ARICEPT) 5 MG tablet Take 1 tablet (5 mg total) by mouth at bedtime. 90 tablet 3  . levothyroxine (SYNTHROID, LEVOTHROID) 50 MCG tablet Take 1 tablet (50 mcg total) by mouth daily before breakfast. 90 tablet 3  . Multiple Vitamins-Minerals (WOMENS MULTIVITAMIN PO) Take 1 tablet by mouth daily.     No current facility-administered medications for this visit.     Allergies  Allergen Reactions  . Bee Venom Anaphylaxis      Review of Systems:  Constitutional:  No  fever, no chills, No recent illness, No unintentional weight changes. +significant fatigue.   HEENT: No  headache, no vision change  Cardiac: No  chest pain, No  pressure, No palpitations, No  Orthopnea  Respiratory:  No  shortness of breath. No  Cough  Gastrointestinal: No  abdominal pain, No  nausea,  Musculoskeletal: No new myalgia/arthralgia  Skin: No  Rash  Endocrine: No cold intolerance,  No heat intolerance. No polyuria/polydipsia/polyphagia   Neurologic: No  weakness, No  dizziness, No  slurred speech/focal weakness/facial droop  Psychiatric: No  concerns with depression, No  concerns with anxiety, +sleep problems, No mood problems  Exam:  BP 117/75  Pulse 83   Wt 131 lb (59.4 kg)   BMI 21.97 kg/m   Constitutional: VS see above. General Appearance: alert, well-developed, well-nourished, NAD  Eyes: Normal lids and conjunctive, non-icteric sclera  Ears, Nose, Mouth, Throat: MMM, Normal external inspection ears/nares/mouth/lips/gums.  Pharynx/tonsils no erythema, no exudate. Nasal mucosa normal.   Neck: No masses, trachea midline. No thyroid enlargement. No tenderness/mass appreciated. No lymphadenopathy  Respiratory: Normal respiratory effort. no wheeze, no  rhonchi, no rales  Cardiovascular: S1/S2 normal, no murmur, no rub/gallop auscultated. RRR. No lower extremity edema.   Gastrointestinal: Nontender, no masses.   Musculoskeletal: Gait normal.   Neurological: Normal balance/coordination. No tremor. No cranial nerve deficit on limited exam. Motor and sensation intact and symmetric. Cerebellar reflexes intact.   Skin: warm, dry, intact. No rash/ulcer.     Psychiatric: Normal judgment/insight. Normal mood and affect. Oriented x3.     ASSESSMENT/PLAN:   Other fatigue - w/ sleeping spells but not quite LOC/syncope, no other assoc symptoms. Basic w/u and would consider neuro/sleep referral. Try check Glc if happens again. No driving.  Plan: CBC, COMPLETE METABOLIC PANEL WITH GFR, TSH, Magnesium, Urinalysis, Routine w reflex microscopic, Ambulatory referral to Sleep Studies  Hypothyroidism, unspecified type - Plan: TSH      Visit summary with medication list and pertinent instructions was printed for patient to review. All questions at time of visit were answered - patient instructed to contact office with any additional concerns. ER/RTC precautions were reviewed with the patient.   Follow-up plan: Return for recheck depending on results/symptoms.  Note: Total time spent 25 minutes, greater than 50% of the visit was spent face-to-face counseling and coordinating care for the following: The primary encounter diagnosis was Other fatigue. A diagnosis of Hypothyroidism, unspecified type was also pertinent to this visit.Marland..  Please note: voice recognition software was used to produce this document, and typos may escape review. Please contact Dr. Sheppard Coil for any needed clarifications.  for chief complaint of: Daytime somnolence, episodes of falling asleep   . Falling asleep in the middle of the day fairly abruptly, no LOC, she is rousable but intensely fatigued. No dizziness, no SOB. Happens when sitting down, once on cough, once at a table.  . No known triggers, maybe d/t low appetite and low Glc?  . 3 episodes over the past 3 weeks  Sleeping more at night, too, sometimes 12+ hours per day    Patient is accompanied by husband who assists with history-taking.   Past medical, surgical, social and family history reviewed:  Patient Active Problem List   Diagnosis Date Noted  . Urinary urgency 01/30/2017  . Upper airway cough syndrome 10/17/2016  . Chronic allergic rhinitis 10/17/2016  . Gastroesophageal reflux disease 10/17/2016  . Screening for colon cancer 10/09/2016  . Globus sensation 09/02/2016  . Benign skin growth 08/23/2016  . Adnexal mass 04/23/2016  . 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 Memory problem 12/06/2015  . Chronic tonsillitis 11/02/2015  . 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 Hypothyroidism 04/01/2015  . Hyperlipidemia 04/01/2015  . Depression 04/01/2015  . Anemia 04/01/2015  . Compression fracture of L1 lumbar vertebra ([place]) 04/01/2015    Past Surgical History:  Procedure Laterality Date  . BACK SURGERY    . TUBAL LIGATION      Social History   Tobacco Use  . Smoking status: Never Smoker  . Smokeless tobacco: Never Used  Substance Use Topics  . Alcohol use: Yes    Alcohol/week: 1.8 oz    Types: 3 Standard drinks or equivalent per week    Family History  Problem Relation Age of Onset  . Heart attack Father   . Hyperlipidemia Mother      Current medication list and allergy/intolerance information reviewed:    Current Outpatient Medications  Medication Sig  Dispense Refill  . aspirin 81 MG EC tablet Take 81 mg by mouth.    Marland Kitchen atorvastatin (LIPITOR) 40 MG tablet TAKE 1 TABLET EVERY DAY 90 tablet 3  . citalopram (CELEXA) 20 MG tablet Take one tablet daily to help with mood 90 tablet 3  . donepezil (ARICEPT) 5 MG tablet Take 1 tablet (5 mg total) by mouth at bedtime. 90 tablet 3  . levothyroxine (SYNTHROID, LEVOTHROID) 50 MCG tablet Take 1 tablet (50 mcg total) by mouth daily before breakfast. 90 tablet 3  . Multiple Vitamins-Minerals (WOMENS MULTIVITAMIN PO) Take 1 tablet by mouth daily.     No current facility-administered medications for this visit.     Allergies  Allergen Reactions  . Bee Venom Anaphylaxis      Review of Systems:  Constitutional:  No  fever, no chills, No recent illness, No unintentional weight changes. +significant fatigue.   HEENT: No  headache, no vision change  Cardiac: No  chest pain, No  pressure, No palpitations, No  Orthopnea  Respiratory:  No  shortness of breath. No  Cough  Gastrointestinal: No  abdominal pain, No  nausea,  Musculoskeletal: No new myalgia/arthralgia  Skin: No  Rash  Endocrine: No cold intolerance,  No heat intolerance. No polyuria/polydipsia/polyphagia   Neurologic: No  weakness, No  dizziness, No  slurred speech/focal weakness/facial droop  Psychiatric: No  concerns with depression, No  concerns with anxiety, +sleep problems, No mood problems  Exam:  BP 117/75  Pulse 83   Wt 131 lb (59.4 kg)   BMI 21.97 kg/m   Constitutional: VS see above. General Appearance: alert, well-developed, well-nourished, NAD  Eyes: Normal lids and conjunctive, non-icteric sclera  Ears, Nose, Mouth, Throat: MMM, Normal external inspection ears/nares/mouth/lips/gums.  Pharynx/tonsils no erythema, no exudate. Nasal mucosa normal.   Neck: No masses, trachea midline. No thyroid enlargement. No tenderness/mass appreciated. No lymphadenopathy  Respiratory: Normal respiratory effort. no wheeze, no  rhonchi, no rales  Cardiovascular: S1/S2 normal, no murmur, no rub/gallop auscultated. RRR. No lower extremity edema.   Gastrointestinal: Nontender, no masses.   Musculoskeletal: Gait normal.   Neurological: Normal balance/coordination. No tremor. No cranial nerve deficit on limited exam. Motor and sensation intact and symmetric. Cerebellar reflexes intact.   Skin: warm, dry, intact. No rash/ulcer.     Psychiatric: Normal judgment/insight. Normal mood and affect. Oriented x3.     ASSESSMENT/PLAN:   Other fatigue - w/ sleeping spells but not quite LOC/syncope, no other assoc symptoms. Basic w/u and would consider neuro/sleep referral. Try check Glc if happens again. No driving.  Plan: CBC, COMPLETE METABOLIC PANEL WITH GFR, TSH, Magnesium, Urinalysis, Routine w reflex microscopic, Ambulatory referral to Sleep Studies  Hypothyroidism, unspecified type - Plan: TSH      Visit summary with medication list and pertinent instructions was printed for patient to review. All questions at time of visit were answered - patient instructed to contact office with any additional concerns. ER/RTC precautions were reviewed with the patient.   Follow-up plan: Return for recheck depending on results/symptoms.  Note: Total time spent 25 minutes, greater than 50% of the visit was spent face-to-face counseling and coordinating care for the following: The primary encounter diagnosis was Other fatigue. A diagnosis of Hypothyroidism, unspecified type was also pertinent to this visit.Marland Kitchen  Please note: voice recognition software was used to produce this document, and typos may escape review. Please contact Dr. Sheppard Coil for any needed clarifications.

## 2017-12-06 ENCOUNTER — Other Ambulatory Visit: Payer: Self-pay | Admitting: Osteopathic Medicine

## 2017-12-06 DIAGNOSIS — R8290 Unspecified abnormal findings in urine: Secondary | ICD-10-CM

## 2017-12-06 LAB — CBC
HEMATOCRIT: 37.1 % (ref 35.0–45.0)
HEMOGLOBIN: 12.8 g/dL (ref 11.7–15.5)
MCH: 31.3 pg (ref 27.0–33.0)
MCHC: 34.5 g/dL (ref 32.0–36.0)
MCV: 90.7 fL (ref 80.0–100.0)
MPV: 10.5 fL (ref 7.5–12.5)
Platelets: 197 10*3/uL (ref 140–400)
RBC: 4.09 10*6/uL (ref 3.80–5.10)
RDW: 12.5 % (ref 11.0–15.0)
WBC: 6.7 10*3/uL (ref 3.8–10.8)

## 2017-12-06 LAB — COMPLETE METABOLIC PANEL WITH GFR
AG RATIO: 1.9 (calc) (ref 1.0–2.5)
ALBUMIN MSPROF: 4.4 g/dL (ref 3.6–5.1)
ALKALINE PHOSPHATASE (APISO): 74 U/L (ref 33–130)
ALT: 29 U/L (ref 6–29)
AST: 32 U/L (ref 10–35)
BUN / CREAT RATIO: 18 (calc) (ref 6–22)
BUN: 19 mg/dL (ref 7–25)
CO2: 26 mmol/L (ref 20–32)
CREATININE: 1.03 mg/dL — AB (ref 0.60–0.93)
Calcium: 9.8 mg/dL (ref 8.6–10.4)
Chloride: 105 mmol/L (ref 98–110)
GFR, EST NON AFRICAN AMERICAN: 54 mL/min/{1.73_m2} — AB (ref >=60)
GFR, Est African American: 63 mL/min/{1.73_m2} (ref >=60)
GLOBULIN: 2.3 g/dL (ref 1.9–3.7)
Glucose, Bld: 114 mg/dL — ABNORMAL HIGH (ref 65–99)
POTASSIUM: 4.2 mmol/L (ref 3.5–5.3)
SODIUM: 139 mmol/L (ref 135–146)
Total Bilirubin: 0.5 mg/dL (ref 0.2–1.2)
Total Protein: 6.7 g/dL (ref 6.1–8.1)

## 2017-12-06 LAB — URINALYSIS, ROUTINE W REFLEX MICROSCOPIC
BACTERIA UA: NONE SEEN /HPF
Bilirubin Urine: NEGATIVE
Glucose, UA: NEGATIVE
HGB URINE DIPSTICK: NEGATIVE
Hyaline Cast: NONE SEEN /LPF
Ketones, ur: NEGATIVE
Nitrite: NEGATIVE
Specific Gravity, Urine: 1.023 (ref 1.001–1.03)
pH: 8 (ref 5.0–8.0)

## 2017-12-06 LAB — TSH: TSH: 3.04 m[IU]/L (ref 0.40–4.50)

## 2017-12-06 LAB — MAGNESIUM: MAGNESIUM: 2.4 mg/dL (ref 1.5–2.5)

## 2017-12-06 NOTE — Progress Notes (Signed)
Add on urine culture, sample looks to be contaminant but given episodes she's been having is worth ruling out infection

## 2018-02-13 ENCOUNTER — Ambulatory Visit (INDEPENDENT_AMBULATORY_CARE_PROVIDER_SITE_OTHER): Payer: Medicare HMO | Admitting: Family Medicine

## 2018-02-13 ENCOUNTER — Encounter: Payer: Self-pay | Admitting: Family Medicine

## 2018-02-13 VITALS — BP 119/79 | HR 70 | Wt 131.0 lb

## 2018-02-13 DIAGNOSIS — M1711 Unilateral primary osteoarthritis, right knee: Secondary | ICD-10-CM | POA: Diagnosis not present

## 2018-02-13 DIAGNOSIS — Z780 Asymptomatic menopausal state: Secondary | ICD-10-CM | POA: Diagnosis not present

## 2018-02-13 DIAGNOSIS — F03A0 Unspecified dementia, mild, without behavioral disturbance, psychotic disturbance, mood disturbance, and anxiety: Secondary | ICD-10-CM

## 2018-02-13 DIAGNOSIS — F0390 Unspecified dementia without behavioral disturbance: Secondary | ICD-10-CM | POA: Diagnosis not present

## 2018-02-13 DIAGNOSIS — S32010S Wedge compression fracture of first lumbar vertebra, sequela: Secondary | ICD-10-CM | POA: Diagnosis not present

## 2018-02-13 DIAGNOSIS — Z1239 Encounter for other screening for malignant neoplasm of breast: Secondary | ICD-10-CM

## 2018-02-13 DIAGNOSIS — Z1231 Encounter for screening mammogram for malignant neoplasm of breast: Secondary | ICD-10-CM

## 2018-02-13 MED ORDER — DICLOFENAC SODIUM 1 % TD GEL: 4.0000 g | Freq: Four times a day (QID) | TRANSDERMAL | 11 refills | Status: DC

## 2018-02-13 NOTE — Progress Notes (Signed)
Subjective:     CC: right knee pain  HPI: Suzanne Patrick notes pain in her right knee.  She notes the pain is mild to moderate and occurs intermittently.  She notes the pain typically is worse with prolonged standing.  The pain is diffuse.  She denies any locking catching or giving way.  She is tried ibuprofen or Aleve which helps intermittently.  She denies any injury or radiating pain fevers or chills.  Her medical history is significant for mild dementia.  She comes to the clinic today with her sister.  Past medical history, Surgical history, Family history not pertinant except as noted below, Social history, Allergies, and medications have been entered into the medical record, reviewed, and no changes needed.   Review of Systems: No headache, visual changes, nausea, vomiting, diarrhea, constipation, dizziness, abdominal pain, skin rash, fevers, chills, night sweats, weight loss, swollen lymph nodes, body aches, joint swelling, muscle aches, chest pain, shortness of breath, mood changes, visual or auditory hallucinations.   Objective:    Vitals:   02/13/18 1423  BP: 119/79  Pulse: 70   General: Well Developed, well nourished, and in no acute distress.  Neuro/Psych: Alert and oriented x3, extra-ocular muscles intact, able to move all 4 extremities, sensation grossly intact. Skin: Warm and dry, no rashes noted.  Respiratory: Not using accessory muscles, speaking in full sentences, trachea midline.  Cardiovascular: Pulses palpable, no extremity edema. Abdomen: Does not appear distended. MSK:  Right knee: No effusion but decreased quadricep bulk and mild valgus deformity Range of motion 0-120 degrees with retropatellar crepitations Nontender Stable ligamentous exam Intact flexion and extension strength Capillary refill and sensation are intact distally.  No results found for this or any previous visit (from the past 24 hour(s)). No results found.  Impression and Recommendations:     Assessment and Plan: 72 y.o. 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  We discussed treatment options.  Plan for extended release Tylenol arthritis 1-2 times a day at baseline as well as topical diclofenac gel.  Additionally discussed quadricep strengthening.  Plan for home exercise program and guided resistance training exercises at the gym.  I am concerned that her mild dementia may interfere with this plan.  Plan to recheck in 6 weeks for update.  Additionally we discussed health maintenance items.  Plan for mammogram and bone density screening.  Patient has a history of L1 compression fracture and likely meets criteria for osteoporosis.   Orders Placed This Encounter  Procedures  . MM SCREENING BREAST TOMO BILATERAL    Standing Status:   Future    Standing Expiration Date:   04/16/2019    Order Specific Question:   Reason for Exam (SYMPTOM  OR DIAGNOSIS REQUIRED)    Answer:   screen breast cancer    Order Specific Question:   Preferred imaging location?    Answer:   [facility]  . DG Bone Density    Standing Status:   Future    Standing Expiration Date:   04/16/2019    Order Specific Question:   Reason for exam:    Answer:   eval bone density    Order Specific Question:   Preferred imaging location?    Answer:   [facility]   Meds ordered this encounter  Medications  . diclofenac sodium (VOLTAREN) 1 % GEL    Sig: Apply 4 g topically 4 (four) times daily. To affected joint.    Dispense:  100 g    Refill:  11    Knee DJD pain not controlled with Naproxen and has GERD.    Discussed warning signs or symptoms. Please see discharge instructions. Patient expresses understanding.

## 2018-02-13 NOTE — Patient Instructions (Signed)
Thank you for coming in today. Work on Astron[employer].  Take tylenol arthritis  650mg   1 pill in the morning and in the afternoon or evening.  Apply the gel up to 4x daily as needed for pain.   Lets recheck in 6 weeks.

## 2018-02-26 ENCOUNTER — Ambulatory Visit (INDEPENDENT_AMBULATORY_CARE_PROVIDER_SITE_OTHER): Payer: Medicare HMO

## 2018-02-26 DIAGNOSIS — Z1231 Encounter for screening mammogram for malignant neoplasm of breast: Secondary | ICD-10-CM | POA: Diagnosis not present

## 2018-02-26 DIAGNOSIS — M8589 Other specified disorders of bone density and structure, multiple sites: Secondary | ICD-10-CM | POA: Diagnosis not present

## 2018-02-26 DIAGNOSIS — Z1239 Encounter for other screening for malignant neoplasm of breast: Secondary | ICD-10-CM

## 2018-02-26 DIAGNOSIS — Z780 Asymptomatic menopausal state: Secondary | ICD-10-CM

## 2018-02-26 DIAGNOSIS — M85851 Other specified disorders of bone density and structure, right thigh: Secondary | ICD-10-CM

## 2018-02-27 ENCOUNTER — Encounter: Payer: Self-pay | Admitting: Family Medicine

## 2018-02-27 DIAGNOSIS — M8580 Other specified disorders of bone density and structure, unspecified site: Secondary | ICD-10-CM | POA: Insufficient documentation

## 2018-03-05 ENCOUNTER — Ambulatory Visit (INDEPENDENT_AMBULATORY_CARE_PROVIDER_SITE_OTHER): Payer: Medicare HMO | Admitting: Osteopathic Medicine

## 2018-03-05 ENCOUNTER — Encounter: Payer: Self-pay | Admitting: Osteopathic Medicine

## 2018-03-05 VITALS — BP 119/98 | HR 66 | Temp 97.7°F | Wt 131.6 lb

## 2018-03-05 DIAGNOSIS — E785 Hyperlipidemia, unspecified: Secondary | ICD-10-CM

## 2018-03-05 DIAGNOSIS — F329 Major depressive disorder, single episode, unspecified: Secondary | ICD-10-CM | POA: Diagnosis not present

## 2018-03-05 DIAGNOSIS — R4189 Other symptoms and signs involving cognitive functions and awareness: Secondary | ICD-10-CM

## 2018-03-05 DIAGNOSIS — E039 Hypothyroidism, unspecified: Secondary | ICD-10-CM | POA: Diagnosis not present

## 2018-03-05 DIAGNOSIS — F32A Depression, unspecified: Secondary | ICD-10-CM

## 2018-03-05 MED ORDER — DONEPEZIL HCL 10 MG PO TABS: 10.0000 mg | ORAL_TABLET | Freq: Every day | ORAL | 3 refills | Status: DC

## 2018-03-05 NOTE — Progress Notes (Signed)
HPI: Suzanne Patrick is a 72 y.o. female who  has no past medical history on file.  she presents to [facility] today, 03/05/18,  for chief complaint of:  Needs paperwork filled out for the [place]  . Due to known, longstanding, stable memory problems her husband would like [place] to fill out forms stating homebound status for the [place] benefits she may be eligible for. No other concerns today or recent changes. They ask if we can increase her memory medication.   Patient is accompanied by husband who assists with history-taking.   Past medical history, surgical history, social history and family history reviewed. No updates needed.   Current medication list and allergy/intolerance information reviewed.    Current Outpatient Medications on File Prior to Visit  Medication Sig Dispense Refill  . atorvastatin (LIPITOR) 40 MG tablet TAKE 1 TABLET EVERY DAY 90 tablet 3  . citalopram (CELEXA) 20 MG tablet Take one tablet daily to help with mood 90 tablet 3  . diclofenac sodium (VOLTAREN) 1 % GEL Apply 4 g topically 4 (four) times daily. To affected joint. 100 g 11  . donepezil (ARICEPT) 5 MG tablet Take 1 tablet (5 mg total) by mouth at bedtime. 90 tablet 3  . levothyroxine (SYNTHROID, LEVOTHROID) 50 MCG tablet Take 1 tablet (50 mcg total) by mouth daily before breakfast. 90 tablet 3  . Multiple Vitamins-Minerals (WOMENS MULTIVITAMIN PO) Take 1 tablet by mouth daily.    Marland Kitchen aspirin 81 MG EC tablet Take 81 mg by mouth.     No current facility-administered medications on file prior to visit.    Allergies  Allergen Reactions  . Bee Venom Anaphylaxis      Review of Systems:  Constitutional: No recent illness  HEENT: No  headache, no vision change  Cardiac: No  chest pain  Respiratory:  No  shortness of breath. No  Cough  Gastrointestinal: No  abdominal pain, no change on bowel habits  Musculoskeletal: No new myalgia/arthralgia  Skin: No  Rash  Hem/Onc: No   easy bruising/bleeding, No  abnormal lumps/bumps  Neurologic: No  weakness, No  Dizziness  Psychiatric: No  concerns with depression, No  concerns with anxiety  Exam:  BP (!) 119/98   Pulse 66   Temp 97.7 F (36.5 C) (Oral)   Wt 131 lb 9.6 oz (59.7 kg)   BMI 22.07 kg/m   Constitutional: VS see above. General Appearance: alert, well-developed, well-nourished, NAD  Eyes: Normal lids and conjunctive, non-icteric sclera  Ears, Nose, Mouth, Throat: MMM, Normal external inspection ears/nares/mouth/lips/gums.  Neck: No masses, trachea midline.   Respiratory: Normal respiratory effort. no wheeze, no rhonchi, no rales  Cardiovascular: S1/S2 normal, no murmur, no rub/gallop auscultated. RRR.   Musculoskeletal: Gait normal. Symmetric and independent movement of all extremities  Neurological: Normal balance/coordination. No tremor.  Skin: warm, dry, intact.   Psychiatric: Normal judgment/insight. Normal mood and affect. Oriented x3.     ASSESSMENT/PLAN: The primary encounter diagnosis was Hypothyroidism, unspecified type. Diagnoses of Cognitive impairment, Hyperlipidemia, unspecified hyperlipidemia type, and Depression, unspecified depression type were also pertinent to this visit.  Filled out [facility] form for homebound status, assistance.    Increased Aricept - discussed this will not improve memory btu may slow decline of dementia      Meds ordered this encounter  Medications  . donepezil (ARICEPT) 10 MG tablet - increased from 5 mg)     Sig: Take 1 tablet (10 mg total) by mouth at bedtime.  Dispense:  90 tablet    Refill:  3    Cancel 5 mg dose, thanks    Follow-up plan: Return in about 3 months (around 06/04/2018) for recheck on memory medications, sooner if needed.   Visit summary with medication list and pertinent instructions was printed for patient to review, alert [place] if any changes needed. All questions at time of visit were answered - patient instructed to contact  office with any additional concerns. ER/RTC precautions were reviewed with the patient and understanding verbalized.   Total time spent 25 minutes, greater than 50% of the visit was counseling and coordinating care for diagnosis of The primary encounter diagnosis was Hypothyroidism, unspecified type. Diagnoses of Cognitive impairment, Hyperlipidemia, unspecified hyperlipidemia type, and Depression, unspecified depression type were also pertinent to this visit.    Please note: voice recognition software was used to produce this document, and typos may escape review. Please contact Dr. Sheppard Coil for any needed clarifications.

## 2018-03-21 ENCOUNTER — Emergency Department (INDEPENDENT_AMBULATORY_CARE_PROVIDER_SITE_OTHER): Payer: Medicare HMO

## 2018-03-21 ENCOUNTER — Other Ambulatory Visit: Payer: Self-pay

## 2018-03-21 ENCOUNTER — Emergency Department
Admission: EM | Admit: 2018-03-21 | Discharge: 2018-03-21 | Disposition: A | Discharge status: Home / Self Care | Payer: Medicare HMO | Source: Home / Self Care | Attending: Family Medicine | Admitting: Family Medicine

## 2018-03-21 DIAGNOSIS — B9789 Other viral agents as the cause of diseases classified elsewhere: Secondary | ICD-10-CM | POA: Diagnosis not present

## 2018-03-21 DIAGNOSIS — R05 Cough: Secondary | ICD-10-CM | POA: Diagnosis not present

## 2018-03-21 DIAGNOSIS — J069 Acute upper respiratory infection, unspecified: Secondary | ICD-10-CM | POA: Diagnosis not present

## 2018-03-21 NOTE — Discharge Instructions (Addendum)
Take plain guaifenesin (1200mg extended release tabs such as Mucinex) twice daily, with plenty of water, for cough and congestion.  May add Pseudoephedrine (30mg, one or two every 4 to 6 hours) for sinus congestion.  Get adequate rest.   °May use Afrin nasal spray (or generic oxymetazoline) each morning for about 5 days and then discontinue.  Also recommend using saline nasal spray several times daily and saline nasal irrigation (AYR is a common brand).  °Try warm salt water gargles for sore throat.  °Stop all antihistamines for now, and other non-prescription cough/cold preparations. °  °  °

## 2018-03-21 NOTE — ED Provider Notes (Signed)
[facility]    CSN: [id number] Arrival date & time: 03/21/18  1223     History   Chief Complaint Chief Complaint  Patient presents with  . Headache    HPI Suzanne Patrick is a 72 y.o. female.   Patient complains of three day history of typical cold-like symptoms developing over several days, including mild sore throat, sinus congestion, headache, fatigue, and cough.  She has had chills but no fever.    The history is provided by the patient.    History reviewed. No pertinent past medical history.  Patient Active Problem List   Diagnosis Date Noted  . Osteopenia determined by x-ray 02/27/2018  . 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 Other fatigue 12/05/2017  . Urinary urgency 01/30/2017  . Upper airway cough syndrome 10/17/2016  . Chronic allergic rhinitis 10/17/2016  . Gastroesophageal reflux disease 10/17/2016  . Screening for colon cancer 10/09/2016  . Globus sensation 09/02/2016  . Benign skin growth 08/23/2016  . Adnexal mass 04/23/2016  . 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 Mild dementia 12/06/2015  . Chronic tonsillitis 11/02/2015  . 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 Hypothyroidism 04/01/2015  . Hyperlipidemia 04/01/2015  . Depression 04/01/2015  . Anemia 04/01/2015  . Compression fracture of L1 lumbar vertebra ([place]) 04/01/2015    Past Surgical History:  Procedure Laterality Date  . BACK SURGERY    . BREAST BIOPSY Right   . BREAST EXCISIONAL BIOPSY Right   . TUBAL LIGATION      OB History   None      Home Medications    Prior to Admission medications   Medication Sig Start Date End Date Taking? Authorizing Provider  atorvastatin (LIPITOR) 40 MG tablet TAKE 1 TABLET EVERY DAY 10/08/17   Emeterio Reeve, DO  citalopram (CELEXA) 20 MG tablet Take one tablet daily to help with mood 07/03/17   Emeterio Reeve, DO  diclofenac sodium (VOLTAREN) 1 % GEL Apply 4 g topically 4 (four) times daily. To affected joint. 02/13/18   Gregor Hams, MD    donepezil (ARICEPT) 10 MG tablet Take 1 tablet (10 mg total) by mouth at bedtime. 03/05/18   Emeterio Reeve, DO  levothyroxine (SYNTHROID, LEVOTHROID) 50 MCG tablet Take 1 tablet (50 mcg total) by mouth daily before breakfast. 07/23/17   Emeterio Reeve, DO  Multiple Vitamins-Minerals (WOMENS MULTIVITAMIN PO) Take 1 tablet by mouth daily. 03/31/17   [provider]    Family History Family History  Problem Relation Age of Onset  . Heart attack Father   . Hyperlipidemia Mother     Social History Social History   Tobacco Use  . Smoking status: Never Smoker  . Smokeless tobacco: Never Used  Substance Use Topics  . Alcohol use: Yes    Alcohol/week: 1.8 oz    Types: 3 Standard drinks or equivalent per week  . Drug use: No     Allergies   Bee venom   Review of Systems Review of Systems + sore throat + cough No pleuritic pain No wheezing + nasal congestion + post-nasal drainage No sinus pain/pressure No itchy/red eyes No earache No hemoptysis No SOB No fever, + chills No nausea No vomiting No abdominal pain No diarrhea No urinary symptoms No skin rash + fatigue No myalgias + headache Used OTC meds without relief   Physical Exam Triage Vital Signs ED Triage Vitals  Enc Vitals Group     BP 03/21/18 1409 109/74     Pulse Rate 03/21/18 1409 72  Resp --      Temp 03/21/18 1409 97.6 F (36.4 C)     Temp Source 03/21/18 1409 Oral     SpO2 03/21/18 1409 99 %     Weight 03/21/18 1410 131 lb (59.4 kg)     Height 03/21/18 1410 5\' 4"  (1.626 m)     Head Circumference --      Peak Flow --      Pain Score 03/21/18 1409 0     Pain Loc --      Pain Edu? --      Excl. in [place]? --    No data found.  Updated Vital Signs BP 109/74 (BP Location: Right Arm)   Pulse 72   Temp 97.6 F (36.4 C) (Oral)   Ht 5\' 4"  (1.626 m)   Wt 131 lb (59.4 kg)   SpO2 99%   BMI 22.49 kg/m   Visual Acuity Right Eye Distance:   Left Eye Distance:   Bilateral  Distance:    Right Eye Near:   Left Eye Near:    Bilateral Near:     Physical Exam Nursing notes and Vital Signs reviewed. Appearance:  Patient appears stated age, and in no acute distress Eyes:  Pupils are equal, round, and reactive to light and accomodation.  Extraocular movement is intact.  Conjunctivae are not inflamed  Ears:  Canals normal.  Tympanic membranes normal.  Nose:  Mildly congested turbinates.  No sinus tenderness.   Pharynx:  Normal Neck:  Supple.  Enlarged posterior/lateral nodes are palpated bilaterally, tender to palpation on the left.   Lungs:  Clear to auscultation.  Breath sounds are equal.  Moving air well. Heart:  Regular rate and rhythm without murmurs, rubs, or gallops.  Abdomen:  Nontender without masses or hepatosplenomegaly.  Bowel sounds are present.  No CVA or flank tenderness.  Extremities:  No edema.  Skin:  No rash present.    UC Treatments / Results  Labs (all labs ordered are listed, but only abnormal results are displayed) Labs Reviewed - No data to display  EKG None  Radiology Dg Chest 2 View  Result Date: 03/21/2018 CLINICAL DATA:  Cough. EXAM: CHEST - 2 VIEW COMPARISON:  None. FINDINGS: The heart size and mediastinal contours are within normal limits. Both lungs are clear. No pneumothorax or pleural effusion is noted. Large hiatal hernia is noted. The visualized skeletal structures are unremarkable. IMPRESSION: No active cardiopulmonary disease.  Large hiatal hernia. Electronically Signed   By: Marijo Conception, M.D.   On: 03/21/2018 15:02    Procedures Procedures (including critical care time)  Medications Ordered in UC Medications - No data to display  Initial Impression / Assessment and Plan / UC Course  I have reviewed the triage vital signs and the nursing notes.  Pertinent labs & imaging results that were available during my care of the patient were reviewed by me and considered in my medical decision making (see chart for  details).    There is no evidence of bacterial infection today.  Treat symptomatically for now  Followup with Family Doctor if not improved in about 10 days.   Final Clinical Impressions(s) / UC Diagnoses   Final diagnoses:  Viral URI with cough     Discharge Instructions     Take plain guaifenesin (1200mg  extended release tabs such as Mucinex) twice daily, with plenty of water, for cough and congestion.  May add Pseudoephedrine (30mg , one or two every 4 to 6 hours) for  sinus congestion.  Get adequate rest.   May use Afrin nasal spray (or generic oxymetazoline) each morning for about 5 days and then discontinue.  Also recommend using saline nasal spray several times daily and saline nasal irrigation (AYR is a common brand).   Try warm salt water gargles for sore throat.  Stop all antihistamines for now, and other non-prescription cough/cold preparations.        ED Prescriptions    None        Kandra Nicolas, MD 03/28/18 1256

## 2018-03-21 NOTE — ED Triage Notes (Signed)
Pt stated that she has been cold and has had a headache for a couple of days.

## 2018-04-03 ENCOUNTER — Ambulatory Visit: Payer: Medicare HMO | Admitting: Family Medicine

## 2018-05-23 ENCOUNTER — Encounter: Payer: Self-pay | Admitting: Physician Assistant

## 2018-05-23 ENCOUNTER — Ambulatory Visit (INDEPENDENT_AMBULATORY_CARE_PROVIDER_SITE_OTHER): Payer: Medicare HMO | Admitting: Physician Assistant

## 2018-05-23 VITALS — BP 91/56 | HR 73 | Ht 64.02 in | Wt 137.0 lb

## 2018-05-23 DIAGNOSIS — N907 Vulvar cyst: Secondary | ICD-10-CM | POA: Insufficient documentation

## 2018-05-23 DIAGNOSIS — R300 Dysuria: Secondary | ICD-10-CM | POA: Diagnosis not present

## 2018-05-23 LAB — POCT URINALYSIS DIPSTICK
Bilirubin, UA: NEGATIVE
Glucose, UA: NEGATIVE
KETONES UA: NEGATIVE
NITRITE UA: NEGATIVE
PH UA: 5.5 (ref 5.0–8.0)
PROTEIN UA: NEGATIVE
RBC UA: NEGATIVE
Urobilinogen, UA: 1 E.U./dL

## 2018-05-23 LAB — WET PREP FOR TRICH, YEAST, CLUE
MICRO NUMBER: 90828193
SPECIMEN QUALITY 3963: ADEQUATE

## 2018-05-23 MED ORDER — METRONIDAZOLE 500 MG PO TABS: 500.0000 mg | ORAL_TABLET | Freq: Two times a day (BID) | ORAL | 0 refills | Status: DC

## 2018-05-23 NOTE — Progress Notes (Signed)
Call pt: wet prep was negative for yeast, trichomonas, bacterial vaginosis; however the wet prep is not always as sensitive. I want you to finish metronidazole and let me know how you feel on Monday.

## 2018-05-23 NOTE — Patient Instructions (Signed)

## 2018-05-23 NOTE — Progress Notes (Signed)
Subjective:    Patient ID: Suzanne Patrick, female    DOB: [birth year], 72 y.o.   MRN: [id number]  HPI  Pt is a 72 yo female with hypothyroidism, HLD who presents to the clinic with 2 weeks of noticing a bump on the right lower genital area. She does not notice the bump is painful but she does have burning when she urinates. Denies any discharge. No abdominal pain. No worsening or changing back pain. She did spend a lot of time in a pool for the last week.   .. Active Ambulatory Problems    Diagnosis Date Noted  . Hypothyroidism 04/01/2015  . Hyperlipidemia 04/01/2015  . Depression 04/01/2015  . Anemia 04/01/2015  . Compression fracture of L1 lumbar vertebra ([facility]) 04/01/2015  . 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 Chronic tonsillitis 11/02/2015  . Mild dementia 12/06/2015  . 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 Adnexal mass 04/23/2016  . Benign skin growth 08/23/2016  . Globus sensation 09/02/2016  . Screening for colon cancer 10/09/2016  . Upper airway cough syndrome 10/17/2016  . Chronic allergic rhinitis 10/17/2016  . Gastroesophageal reflux disease 10/17/2016  . Urinary urgency 01/30/2017  . Other fatigue 12/05/2017  . 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 Osteopenia determined by x-ray 02/27/2018  . Epidermal cyst of vulva 05/23/2018   Resolved Ambulatory Problems    Diagnosis Date Noted  . No Resolved Ambulatory Problems   No Additional Past Medical History      Review of Systems See HPI.     Objective:   Physical Exam  Constitutional: She is oriented to person, place, and time. She appears well-developed and well-nourished.  Cardiovascular: Normal rate.  Pulmonary/Chest: Effort normal.  No CVA tenderness.   Abdominal: Soft. Bowel sounds are normal. She exhibits no distension and no mass. There is no tenderness. There is no rebound and no guarding.  Genitourinary:  Genitourinary Comments: Scant brown to yellow discharge.  Mild tenderness with bimanuel exam.  Epidermal  cyst of right lower labia majora/perineium area. Not inflamed or tender to touch.   Neurological: She is alert and oriented to person, place, and time.  Psychiatric: She has a normal mood and affect. Her behavior is normal.          Assessment & Plan:  Marland KitchenMarland KitchenAunika was seen today for gynecologic exam.  Diagnoses and all orders for this visit:  Dysuria -     POCT Urinalysis Dipstick -     Urine Culture -     WET PREP FOR TRICH, YEAST, CLUE  Epidermal cyst of vulva  Other orders -     metroNIDAZOLE (FLAGYL) 500 MG tablet; Take 1 tablet (500 mg total) by mouth 2 (two) times daily. For 7 days.   .. Results for orders placed or performed in visit on 05/23/18  POCT Urinalysis Dipstick  Result Value Ref Range   Color, UA yellow    Clarity, UA clear    Glucose, UA Negative Negative   Bilirubin, UA negative    Ketones, UA negative    Spec Grav, UA >=1.030 (A) 1.010 - 1.025   Blood, UA negative    pH, UA 5.5 5.0 - 8.0   Protein, UA Negative Negative   Urobilinogen, UA 1.0 0.2 or 1.0 E.U./dL   Nitrite, UA negative    Leukocytes, UA Trace (A) Negative   Appearance     Odor       UA positive for leuks only. Will culture. No hx of UTI's and symptoms are not  consistent. Looking back she has had bV with similar symptoms before. Will treat empirically for BV with metronidazole. Wet prep ordered. Differential include UTI, atrophic vaginitis, yeast infection, BV. Follow up as needed.   Discussed and reassured patient she does not need to do anything for epidermal cyst of vulva. I do not think it is causing any problems. If it gets bigger or starts causing problems of its own we could consider removal.

## 2018-05-24 LAB — URINE CULTURE
MICRO NUMBER:: 90830186
SPECIMEN QUALITY: ADEQUATE

## 2018-05-25 NOTE — Progress Notes (Signed)
Call pt: negative urine culture. How are symptoms today?

## 2018-06-12 IMAGING — DX DG LUMBAR SPINE COMPLETE 4+V
5 series · 5 of 5 positions shown · non-contrast
Comparison: CT 04/23/2016.

CLINICAL DATA: Back pain.

EXAM:
LUMBAR SPINE - COMPLETE 4+ VIEW

[l-spine ap]
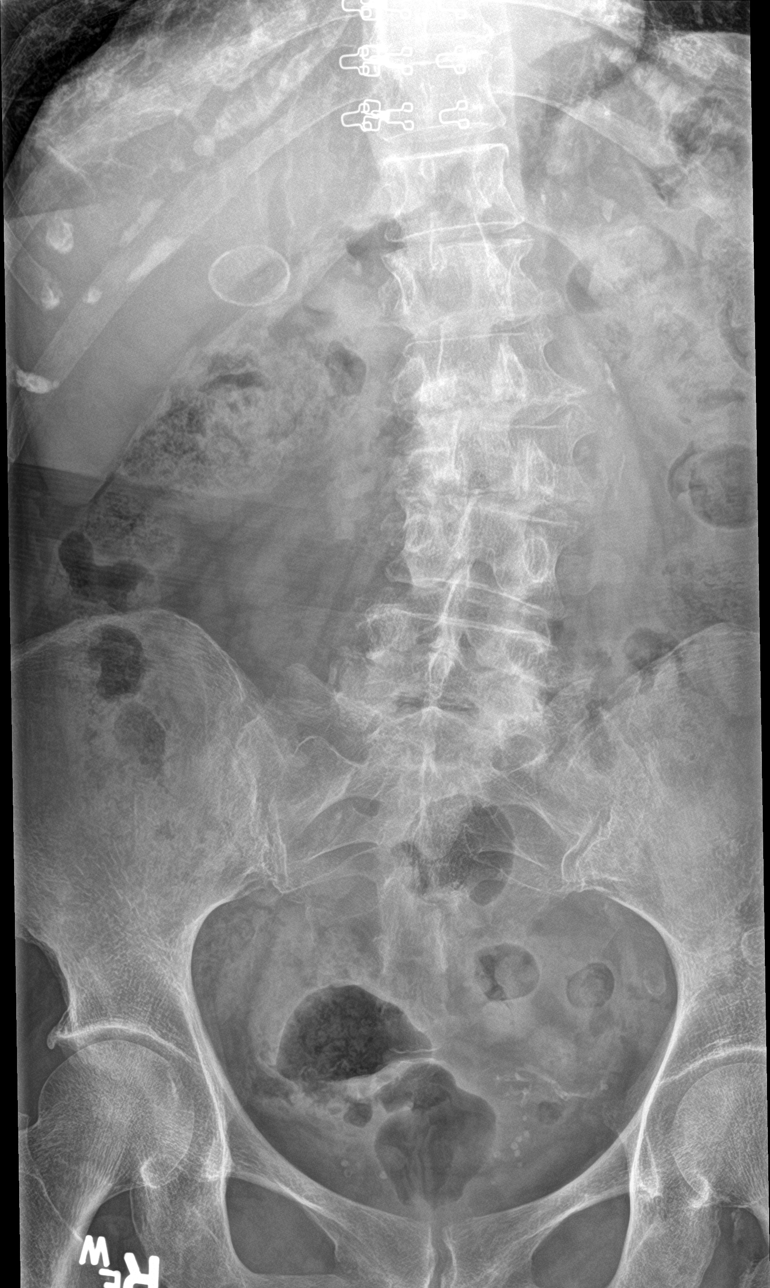

[l-spine obl (1 of 2)]
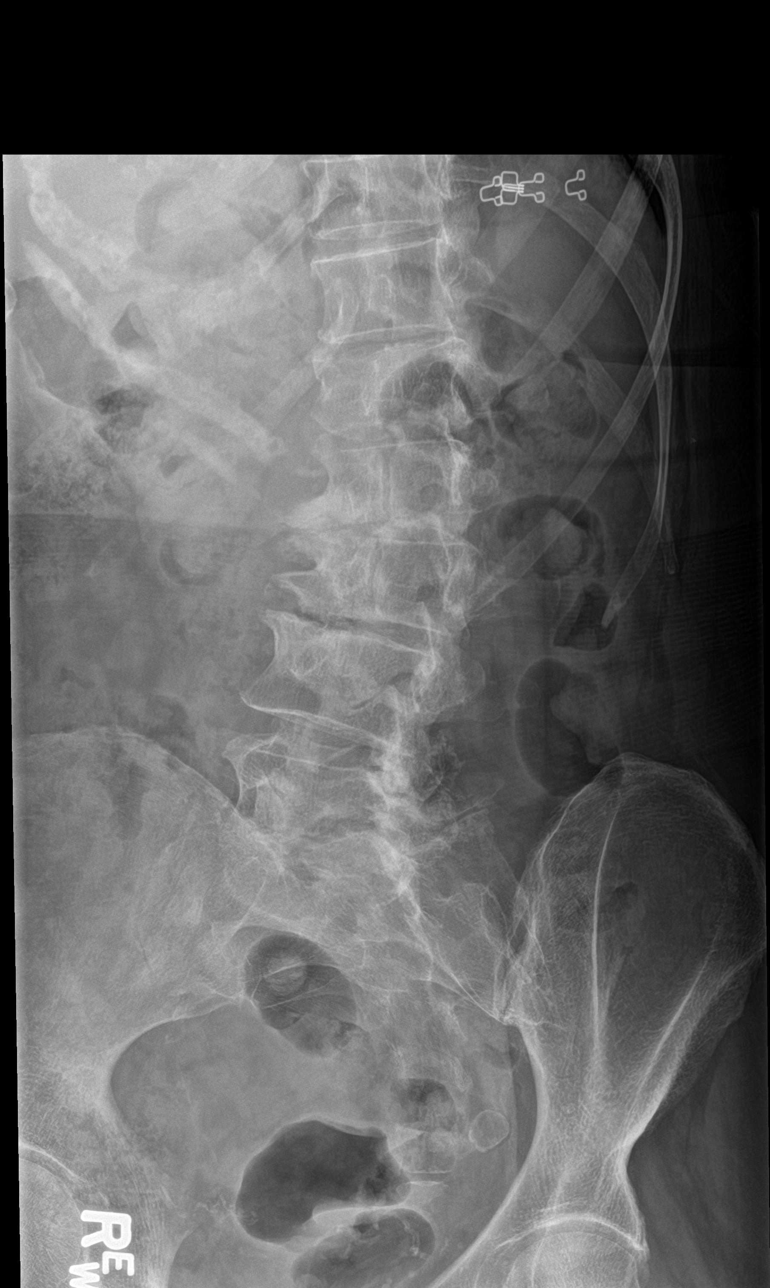

[l-spine obl (2 of 2)]
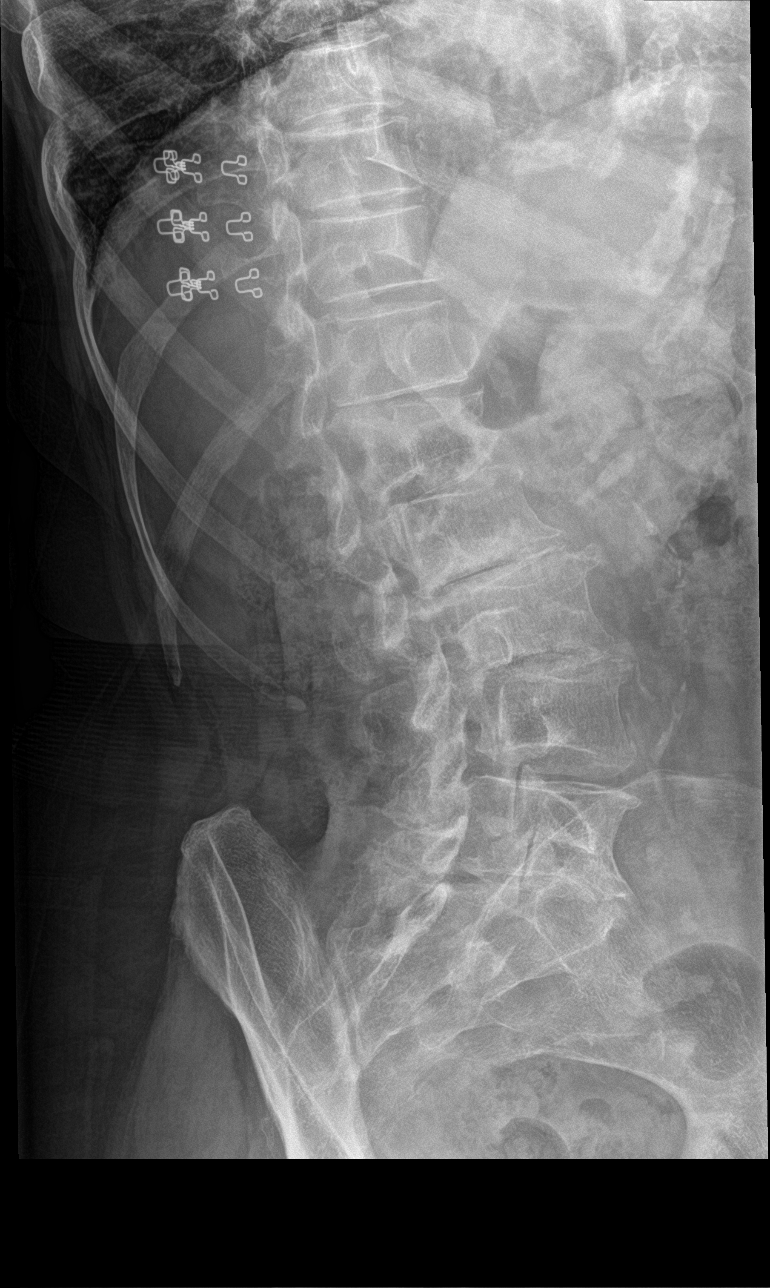

[l-spine lat]
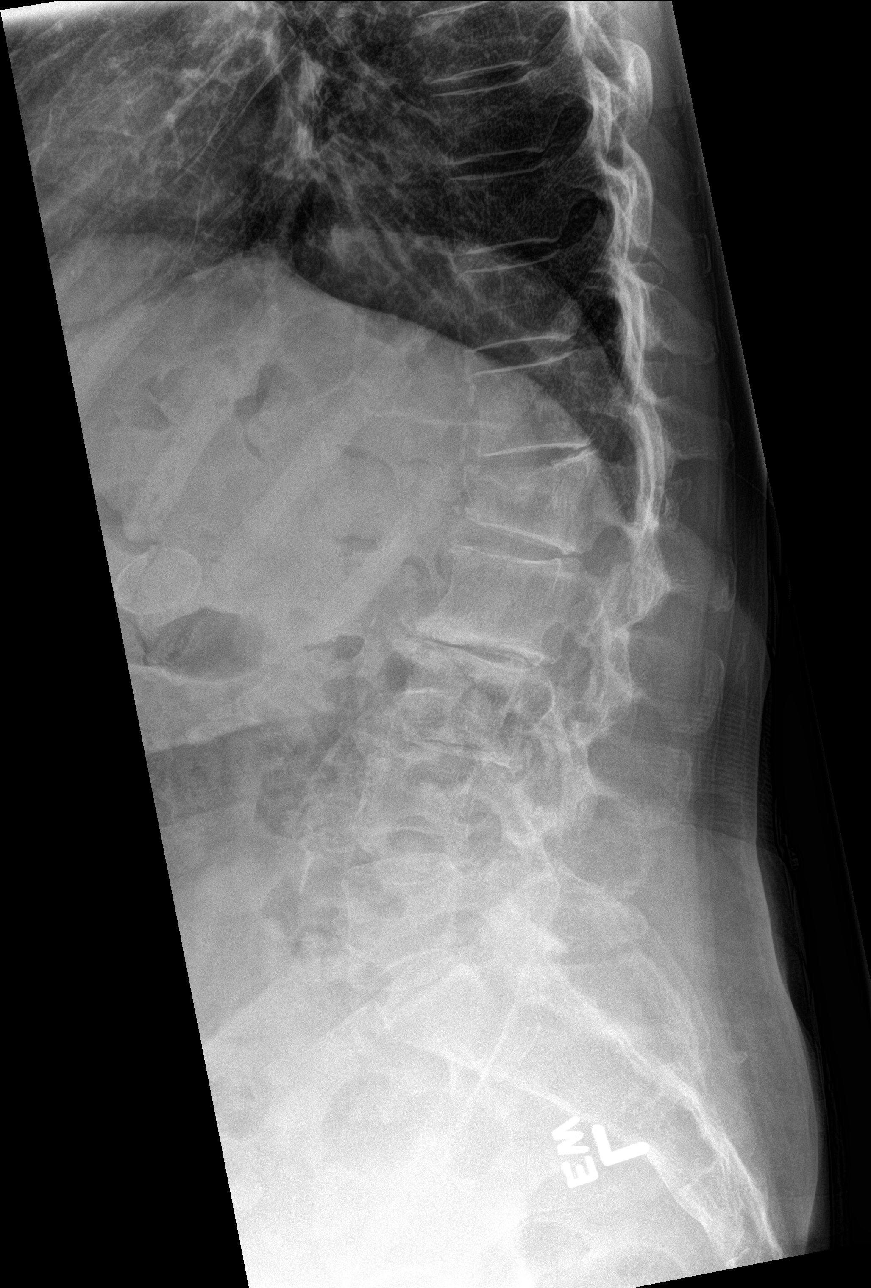

[l-spine spot]
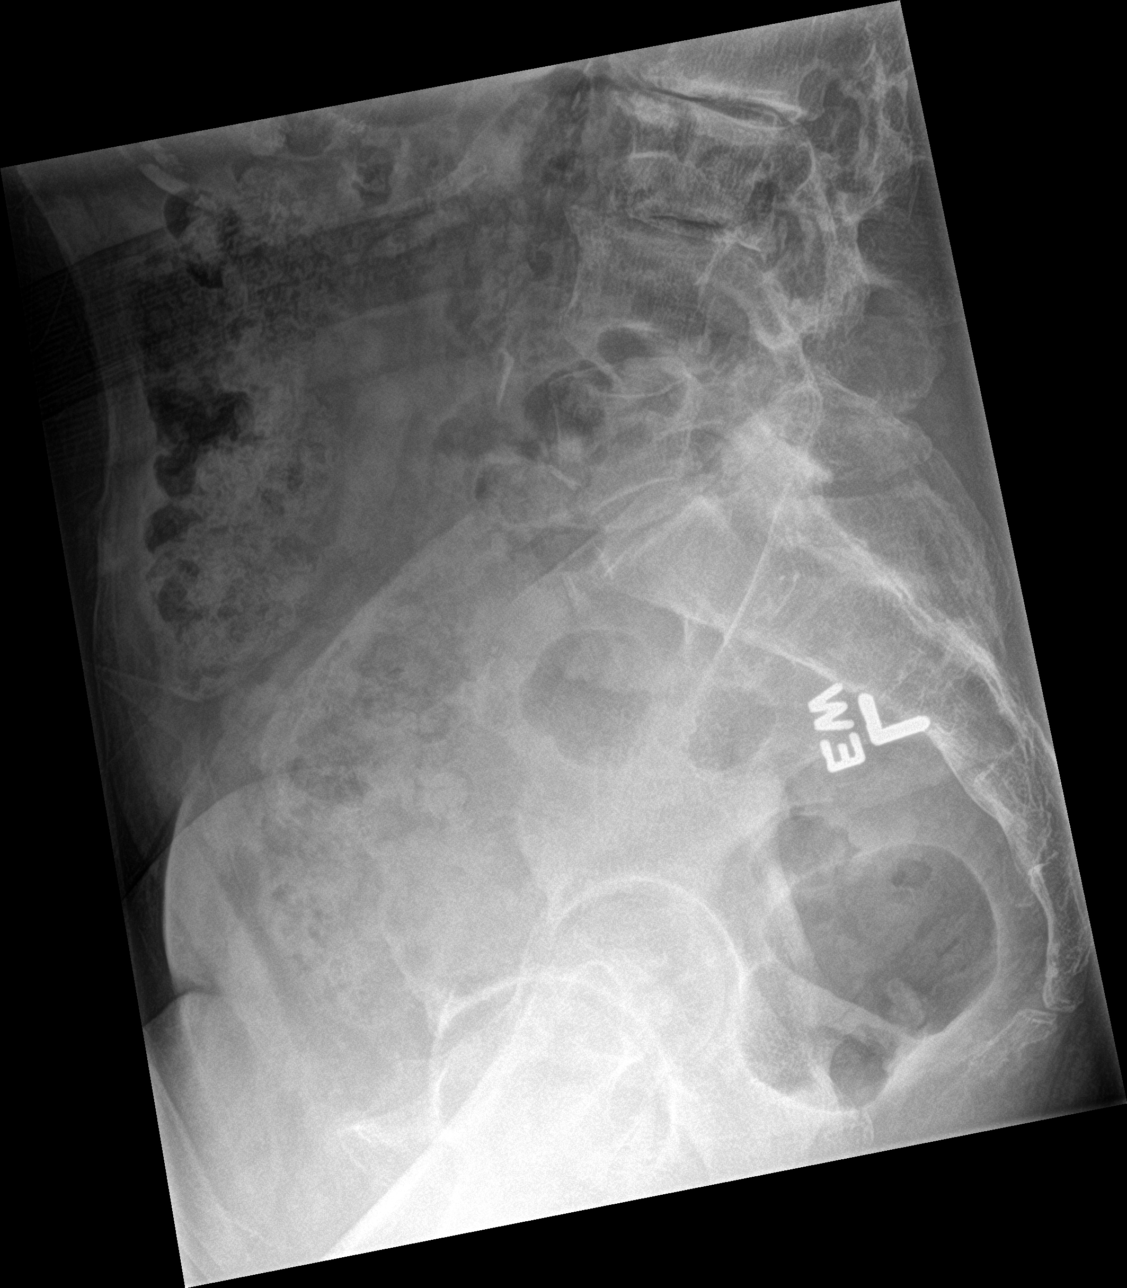

[5 of 5 positions shown; findings below may reference images not displayed]

FINDINGS: Lumbar spine scoliosis concave right. No acute bony abnormality
identified. 6 mm retrolisthesis L3 on L4. Moderate L1 compression
fracture, unchanged. Aortic atherosclerotic vascular calcification.
Rounded calcification in the right upper quadrant consistent with a
a prominent gallstone.
IMPRESSION: 1. Prominent lumbar spine scoliosis and diffuse multilevel
degenerative change. L3 retrolisthesis on L4 of 6 mm.

2. Moderate L1 compression fracture, unchanged.

3.  Aortic atherosclerotic vascular disease.

4.  Prominent gallstone.

## 2018-06-19 ENCOUNTER — Telehealth: Payer: Self-pay | Admitting: Osteopathic Medicine

## 2018-06-19 DIAGNOSIS — Z1211 Encounter for screening for malignant neoplasm of colon: Secondary | ICD-10-CM

## 2018-06-19 NOTE — Telephone Encounter (Signed)
Pt's husband called to state he would like to schedule wife for colonoscopy. Referral placed.

## 2018-06-27 ENCOUNTER — Other Ambulatory Visit: Payer: Self-pay | Admitting: Osteopathic Medicine

## 2018-06-27 DIAGNOSIS — E039 Hypothyroidism, unspecified: Secondary | ICD-10-CM

## 2018-07-28 ENCOUNTER — Ambulatory Visit (INDEPENDENT_AMBULATORY_CARE_PROVIDER_SITE_OTHER): Payer: Medicare HMO | Admitting: Osteopathic Medicine

## 2018-07-28 ENCOUNTER — Encounter: Payer: Self-pay | Admitting: Osteopathic Medicine

## 2018-07-28 VITALS — BP 105/61 | HR 78 | Temp 98.2°F | Wt 131.3 lb

## 2018-07-28 DIAGNOSIS — H02823 Cysts of right eye, unspecified eyelid: Secondary | ICD-10-CM

## 2018-07-28 NOTE — Patient Instructions (Signed)
Try warm compresses as directed, will refer to eye specialist ASAP!

## 2018-07-28 NOTE — Progress Notes (Signed)
HPI: Suzanne Patrick is a 72 y.o. female who  has no past medical history on file.  she presents to [facility] today, 07/28/18,  for chief complaint of:  Eye concern  R upper eyelid. Been there for a few weeks but getting bigger, nonpainful, no drainage, no eye pain.        Past medical history, surgical history, and family history reviewed.  Current medication list and allergy/intolerance information reviewed.   (See remainder of HPI, ROS, Phys Exam below)    ASSESSMENT/PLAN:   Cyst of right eyelid - Plan: Ambulatory referral to Ophthalmology to discuss drainage vs excision of cyst     Patient Instructions  Try warm compresses as directed, will refer to eye specialist ASAP!    Follow-up plan: Return if symptoms worsen or change, otherwise will see if we can get you in to optho in the next few days!.                 ############################################ ############################################ ############################################ ############################################    Outpatient Encounter Medications as of 07/28/2018  Medication Sig  . atorvastatin (LIPITOR) 40 MG tablet TAKE 1 TABLET EVERY DAY  . citalopram (CELEXA) 20 MG tablet Take one tablet daily to help with mood  . diclofenac sodium (VOLTAREN) 1 % GEL Apply 4 g topically 4 (four) times daily. To affected joint.  Marland Kitchen donepezil (ARICEPT) 10 MG tablet Take 1 tablet (10 mg total) by mouth at bedtime.  Marland Kitchen levothyroxine (SYNTHROID, LEVOTHROID) 50 MCG tablet Take 1 tablet (50 mcg total) by mouth daily before breakfast. Pt needs labs & f/u appt w/PCP for further RFs  . metroNIDAZOLE (FLAGYL) 500 MG tablet Take 1 tablet (500 mg total) by mouth 2 (two) times daily. For 7 days.  . Multiple Vitamins-Minerals (WOMENS MULTIVITAMIN PO) Take 1 tablet by mouth daily.   No facility-administered encounter medications on file as of 07/28/2018.    Allergies   Allergen Reactions  . Bee Venom Anaphylaxis      Review of Systems:  Constitutional: No recent illness  HEENT: No  headache, no vision change, +eyelid problem as per HPI   Cardiac: No  chest pain  Respiratory:  No  shortness of breath.  Skin: No  Rash   Exam:  BP 105/61 (BP Location: Left Arm, Patient Position: Sitting, Cuff Size: Normal)   Pulse 78   Temp 98.2 F (36.8 C) (Oral)   Wt 131 lb 4.8 oz (59.6 kg)   BMI 22.53 kg/m   Constitutional: VS see above. General Appearance: alert, well-developed, well-nourished, NAD  Eyes: Normal lids and conjunctive, non-icteric sclera  Ears, Nose, Mouth, Throat: MMM, Normal external inspection ears/nares/mouth/lips/gums.  Neck: No masses, trachea midline.   Respiratory: Normal respiratory effort.   Neurological: Normal balance/coordination. No tremor.  Skin: warm, dry, intact.    Visit summary with medication list and pertinent instructions was printed for patient to review, advised to alert [place] if any changes needed. All questions at time of visit were answered - patient instructed to contact office with any additional concerns. ER/RTC precautions were reviewed with the patient and understanding verbalized.   Follow-up plan: Return if symptoms worsen or change, otherwise will see if we can get you in to optho in the next few days!.     Please note: voice recognition software was used to produce this document, and typos may escape review. Please contact Dr. Sheppard Coil for any needed clarifications.

## 2018-08-28 DIAGNOSIS — H00021 Hordeolum internum right upper eyelid: Secondary | ICD-10-CM | POA: Diagnosis not present

## 2018-09-10 ENCOUNTER — Ambulatory Visit (INDEPENDENT_AMBULATORY_CARE_PROVIDER_SITE_OTHER): Payer: Medicare HMO | Admitting: Osteopathic Medicine

## 2018-09-10 DIAGNOSIS — Z23 Encounter for immunization: Secondary | ICD-10-CM

## 2018-10-18 ENCOUNTER — Other Ambulatory Visit: Payer: Self-pay | Admitting: Osteopathic Medicine

## 2018-10-18 DIAGNOSIS — E039 Hypothyroidism, unspecified: Secondary | ICD-10-CM

## 2018-10-20 ENCOUNTER — Ambulatory Visit (INDEPENDENT_AMBULATORY_CARE_PROVIDER_SITE_OTHER): Payer: Medicare HMO

## 2018-10-20 ENCOUNTER — Encounter: Payer: Self-pay | Admitting: Osteopathic Medicine

## 2018-10-20 ENCOUNTER — Ambulatory Visit (INDEPENDENT_AMBULATORY_CARE_PROVIDER_SITE_OTHER): Payer: Medicare HMO | Admitting: Osteopathic Medicine

## 2018-10-20 VITALS — BP 117/80 | HR 70 | Ht 64.02 in | Wt 130.0 lb

## 2018-10-20 DIAGNOSIS — Y92009 Unspecified place in unspecified non-institutional (private) residence as the place of occurrence of the external cause: Secondary | ICD-10-CM

## 2018-10-20 DIAGNOSIS — M4856XA Collapsed vertebra, not elsewhere classified, lumbar region, initial encounter for fracture: Secondary | ICD-10-CM

## 2018-10-20 DIAGNOSIS — M545 Low back pain, unspecified: Secondary | ICD-10-CM

## 2018-10-20 DIAGNOSIS — M5136 Other intervertebral disc degeneration, lumbar region: Secondary | ICD-10-CM | POA: Diagnosis not present

## 2018-10-20 DIAGNOSIS — W19XXXA Unspecified fall, initial encounter: Secondary | ICD-10-CM

## 2018-10-20 DIAGNOSIS — M533 Sacrococcygeal disorders, not elsewhere classified: Secondary | ICD-10-CM

## 2018-10-20 DIAGNOSIS — S32010A Wedge compression fracture of first lumbar vertebra, initial encounter for closed fracture: Secondary | ICD-10-CM | POA: Diagnosis not present

## 2018-10-20 NOTE — Patient Instructions (Signed)
Will see what radiologist has to say - may consider starting Fosamax pills dialy or weekly, or Prolia injections every 6 months. Ensure adequate calcium and vitamin D intake: Calcium 1200-1300 mg per day, vitamin D [phone] units per day.

## 2018-10-20 NOTE — Progress Notes (Signed)
HPI: Suzanne Patrick is a 72 y.o. female who  has no past medical history on file.  she presents to [facility] today, 10/20/18,  for chief complaint of:  [place], tailbone pain   . fell 5 days ago, off deck slipped off stop step, landed on tailbone and lower back, small bump on back of her head is getting better, no headache. Shoulder also hit but feels better. Lying down flat hurts her lower coccyx and lower back.       At today's visit... Past medical history, surgical history, and family history reviewed and updated as needed.  Current medication list and allergy/intolerance information reviewed and updated as needed. (See remainder of HPI, ROS, Phys Exam below)       XR personally reviewed, appreciate curbside from Dr T  L1 compression fx looks old L4 ?compression fx ?sacral fx Await radiology over-read    No results found.         ASSESSMENT/PLAN: The primary encounter diagnosis was Fall in home, initial encounter. Diagnoses of Coccyx pain and Acute midline low back pain without sciatica were also pertinent to this visit.   Discussed conservative measures, consider f/u Dr T for injection into sacrum if pain persists, pt is ok with OTC pain meds prn  Orders Placed This Encounter  Procedures  . DG Sacrum/Coccyx  . DG Lumbar Spine 2-3 Views       Patient Instructions  Will see what radiologist has to say - may consider starting Fosamax pills dialy or weekly, or Prolia injections every 6 months. Ensure adequate calcium and vitamin D intake: Calcium 1200-1300 mg per day, vitamin D [phone] units per day.       Follow-up plan: Return if symptoms worsen or fail to improve - see Dr T  .                             ############################################ ############################################ ############################################ ############################################    Current Meds  Medication Sig  . atorvastatin (LIPITOR) 40 MG tablet TAKE 1 TABLET EVERY DAY  . citalopram (CELEXA) 20 MG tablet Take one tablet daily to help with mood  . diclofenac sodium (VOLTAREN) 1 % GEL Apply 4 g topically 4 (four) times daily. To affected joint.  Marland Kitchen donepezil (ARICEPT) 10 MG tablet Take 1 tablet (10 mg total) by mouth at bedtime.  Marland Kitchen levothyroxine (SYNTHROID, LEVOTHROID) 50 MCG tablet Take 1 tablet (50 mcg total) by mouth daily before breakfast. Pt needs labs & f/u appt w/PCP for further RFs  . metroNIDAZOLE (FLAGYL) 500 MG tablet Take 1 tablet (500 mg total) by mouth 2 (two) times daily. For 7 days.  . Multiple Vitamins-Minerals (WOMENS MULTIVITAMIN PO) Take 1 tablet by mouth daily.    Allergies  Allergen Reactions  . Bee Venom Anaphylaxis       Review of Systems:  Constitutional: No recent illness  HEENT: No  headache, no vision change  Cardiac: No  chest pain, No  pressure, No palpitations  Respiratory:  No  shortness of breath. No  Cough  Musculoskeletal: +new myalgia/arthralgia  Skin: No  Rash  Hem/Onc: No  easy bruising/bleeding  Neurologic: No  weakness, No  Dizziness   Exam:  BP 117/80   Pulse 70   Ht 5' 4.02" (1.626 m)   Wt 130 lb (59 kg)   SpO2 100%   BMI 22.30 kg/m   Constitutional: VS see above. General Appearance: alert, well-developed,  well-nourished, NAD  Eyes: Normal lids and conjunctive, non-icteric sclera  Ears, Nose, Mouth, Throat: MMM, Normal external inspection ears/nares/mouth/lips/gums.  Neck: No masses, trachea midline.   Respiratory: Normal respiratory effort. no wheeze, no rhonchi, no rales  Cardiovascular: S1/S2 normal, no murmur, no rub/gallop auscultated. RRR.    Musculoskeletal: Gait normal. Symmetric and independent movement of all extremities  (+)tenderness coccyx and lower lumbar spine midline at base of spine  Neurological: Normal balance/coordination. No tremor.  Skin: warm, dry, intact.   Psychiatric: Normal judgment/insight. Normal mood and affect. Oriented x3.       Visit summary with medication list and pertinent instructions was printed for patient to review, patient was advised to alert [place] if any updates are needed. All questions at time of visit were answered - patient instructed to contact office with any additional concerns. ER/RTC precautions were reviewed with the patient and understanding verbalized.    Please note: voice recognition software was used to produce this document, and typos may escape review. Please contact Dr. Sheppard Coil for any needed clarifications.    Follow up plan: Return if symptoms worsen or fail to improve - see Dr T .

## 2018-10-22 MED ORDER — ALENDRONATE SODIUM 70 MG PO TABS: 70.0000 mg | ORAL_TABLET | ORAL | 3 refills | Status: DC

## 2019-01-01 ENCOUNTER — Other Ambulatory Visit: Payer: Self-pay | Admitting: Osteopathic Medicine

## 2019-01-01 DIAGNOSIS — E039 Hypothyroidism, unspecified: Secondary | ICD-10-CM

## 2019-01-01 NOTE — Telephone Encounter (Signed)
[facility] pharmacy requesting med refills for levothyroxine and citalopram. Last thyroid check was 12/05/17. Pls advise, thanks

## 2019-01-02 NOTE — Telephone Encounter (Signed)
Pt's husband has been updated. Agrees w/recommendation & transferred to scheduling desk to make an appt for pt.

## 2019-01-02 NOTE — Telephone Encounter (Signed)
Patient is overdue for lab work.  I'm ok to refill medicines but can we get her in with Maudie Mercury to schedule a Medicare visit and can do a follow-up with me the same day to address chronic medical issues? Thanks!

## 2019-01-06 ENCOUNTER — Ambulatory Visit: Payer: Medicare HMO

## 2019-01-06 ENCOUNTER — Ambulatory Visit (INDEPENDENT_AMBULATORY_CARE_PROVIDER_SITE_OTHER): Payer: Medicare HMO | Admitting: Osteopathic Medicine

## 2019-01-06 ENCOUNTER — Encounter: Payer: Self-pay | Admitting: Osteopathic Medicine

## 2019-01-06 VITALS — BP 106/75 | HR 75 | Temp 97.7°F | Wt 126.9 lb

## 2019-01-06 DIAGNOSIS — E782 Mixed hyperlipidemia: Secondary | ICD-10-CM

## 2019-01-06 DIAGNOSIS — E039 Hypothyroidism, unspecified: Secondary | ICD-10-CM | POA: Diagnosis not present

## 2019-01-06 DIAGNOSIS — M8580 Other specified disorders of bone density and structure, unspecified site: Secondary | ICD-10-CM

## 2019-01-06 DIAGNOSIS — R7301 Impaired fasting glucose: Secondary | ICD-10-CM | POA: Diagnosis not present

## 2019-01-06 NOTE — Progress Notes (Signed)
HPI: Suzanne Patrick is a 73 y.o. female who  has no past medical history on file.  she presents to [facility] today, 01/06/19,  for chief complaint of:  Follow-up labs, BP, thyroid   Patient is due to recheck thyroid labs, orders are in.  No palpitations, weight changes, heat or cold intolerance.  Patient is fasting today for labs, also due for cholesterol check.  Otherwise, feeling fine today.  Medicare wellness visit had to be rescheduled due to nurse being out sick.   BP Readings from Last 3 Encounters:  01/06/19 106/75  10/20/18 117/80  07/28/18 105/61    Patient is accompanied by husband who assists with history-taking.    At today's visit 01/06/19 ... PMH, PSH, FH reviewed and updated as needed.  Current medication list and allergy/intolerance hx reviewed and updated as needed. (See remainder of HPI, ROS, Phys Exam below)           ASSESSMENT/PLAN: The primary encounter diagnosis was Hypothyroidism, unspecified type. Diagnoses of Mixed hyperlipidemia and Osteopenia determined by x-ray were also pertinent to this visit.   Orders Placed This Encounter  Procedures  . CBC  . COMPLETE METABOLIC PANEL WITH GFR  . Lipid panel  . TSH           Follow-up plan: Return for Medicare wellness exam needs to be rescheduled, as long as labs okay can see me in a year.                                                 ################################################# ################################################# ################################################# #################################################    Current Meds  Medication Sig  . alendronate (FOSAMAX) 70 MG tablet Take 1 tablet (70 mg total) by mouth every 7 (seven) days.  Marland Kitchen atorvastatin (LIPITOR) 40 MG tablet TAKE 1 TABLET EVERY DAY  . citalopram (CELEXA) 20 MG tablet TAKE ONE TABLET DAILY TO HELP WITH MOOD   . diclofenac sodium (VOLTAREN) 1 % GEL Apply 4 g topically 4 (four) times daily. To affected joint.  Marland Kitchen donepezil (ARICEPT) 10 MG tablet Take 1 tablet (10 mg total) by mouth at bedtime.  Marland Kitchen levothyroxine (SYNTHROID, LEVOTHROID) 50 MCG tablet TAKE 1 TABLET DAILY BEFORE BREAKFAST.  . Multiple Vitamins-Minerals (WOMENS MULTIVITAMIN PO) Take 1 tablet by mouth daily.    Allergies  Allergen Reactions  . Bee Venom Anaphylaxis       Review of Systems:  Constitutional: No recent illness  HEENT: No  headache, no vision change  Cardiac: No  chest pain, No  pressure, No palpitations  Respiratory:  No  shortness of breath. No  Cough  Gastrointestinal: No  abdominal pain, no change on bowel habits  Musculoskeletal: No new myalgia/arthralgia  Skin: No  Rash  Hem/Onc: No  easy bruising/bleeding, No  abnormal lumps/bumps  Neurologic: No  weakness, No  Dizziness  Psychiatric: No  concerns with depression, No  concerns with anxiety  Exam:  BP 106/75 (BP Location: Left Arm, Patient Position: Sitting, Cuff Size: Normal)   Pulse 75   Temp 97.7 F (36.5 C) (Oral)   Wt 126 lb 14.4 oz (57.6 kg)   BMI 21.77 kg/m   Constitutional: VS see above. General Appearance: alert, well-developed, well-nourished, NAD  Eyes: Normal lids and conjunctive, non-icteric sclera  Ears, Nose, Mouth, Throat: MMM, Normal external inspection ears/nares/mouth/lips/gums.  Neck: No masses, trachea  midline.   Respiratory: Normal respiratory effort. no wheeze, no rhonchi, no rales  Cardiovascular: S1/S2 normal, no murmur, no rub/gallop auscultated. RRR.   Musculoskeletal: Gait normal. Symmetric and independent movement of all extremities  Neurological: Normal balance/coordination. No tremor.  Skin: warm, dry, intact.   Psychiatric: Normal judgment/insight. Normal mood and affect. Oriented x3.       Visit summary with medication list and pertinent instructions was printed for patient to review, patient  was advised to alert [place] if any updates are needed. All questions at time of visit were answered - patient instructed to contact office with any additional concerns. ER/RTC precautions were reviewed with the patient and understanding verbalized.   Note: Total time spent 15 minutes, greater than 50% of the visit was spent face-to-face counseling and coordinating care for the following: The primary encounter diagnosis was Hypothyroidism, unspecified type. Diagnoses of Mixed hyperlipidemia and Osteopenia determined by x-ray were also pertinent to this visit.Marland Kitchen  Please note: voice recognition software was used to produce this document, and typos may escape review. Please contact Dr. Sheppard Coil for any needed clarifications.    Follow up plan: Return for Medicare wellness exam needs to be rescheduled, as long as labs okay can see me in a year.

## 2019-01-07 ENCOUNTER — Other Ambulatory Visit: Payer: Self-pay | Admitting: Osteopathic Medicine

## 2019-01-07 NOTE — Telephone Encounter (Signed)
Requested medication (s) are due for refill today: yes  Requested medication (s) are on the active medication list: yes  Last refill:  10/21/18 #90  Future visit scheduled: no  Notes to clinic:  Routed to Dr. Redgie Grayer refill rx pool   Requested Prescriptions  Pending Prescriptions Disp Refills   atorvastatin (LIPITOR) 40 MG tablet [Pharmacy Med Name: ATORVASTATIN CALCIUM 40 MG Tablet] 90 tablet 0    Sig: TAKE 1 TABLET EVERY DAY     Cardiovascular:  Antilipid - Statins Failed - 01/07/2019  3:02 PM      Failed - HDL in normal range and within 360 days    HDL  Date Value Ref Range Status  01/06/2019 40 (L) > OR = 50 mg/dL Final         Failed - Triglycerides in normal range and within 360 days    Triglycerides  Date Value Ref Range Status  01/06/2019 158 (H) <150 mg/dL Final         Failed - Valid encounter within last 12 months    Recent Outpatient Visits          Yesterday Hypothyroidism, unspecified type   [facility], Lanelle Bal, DO   2 months ago Fall in home, initial encounter   [facility], Lanelle Bal, DO   5 months ago Cyst of right eyelid   [place], Lanelle Bal, DO   7 months ago [place], [place], PA-C   10 months ago Hypothyroidism, unspecified type   [facility], DO      Future Appointments            In 5 days [facility] - Total Cholesterol in normal range and within 360 days    Cholesterol  Date Value Ref Range Status  01/06/2019 128 <200 mg/dL Final         Passed - LDL in normal range and within 360 days    LDL Cholesterol (Calc)  Date Value Ref Range Status  01/06/2019 64 mg/dL (calc) Final    Comment:    Reference range: <100 . Desirable  range <100 mg/dL for primary prevention;   <70 mg/dL for patients with CHD or diabetic patients  with > or = 2 CHD risk factors. Marland Kitchen LDL-C is now calculated using the Martin-Hopkins  calculation, which is a validated novel method providing  better accuracy than the Friedewald equation in the  estimation of LDL-C.  Cresenciano Genre et al. Annamaria Helling. 1694;503(88): 2061-2068  (http://education.[employer].com/faq/FAQ164)          Passed - Patient is not pregnant

## 2019-01-08 LAB — COMPLETE METABOLIC PANEL WITH GFR
AG Ratio: 2 (calc) (ref 1.0–2.5)
ALT: 29 U/L (ref 6–29)
AST: 31 U/L (ref 10–35)
Albumin: 4.3 g/dL (ref 3.6–5.1)
Alkaline phosphatase (APISO): 62 U/L (ref 37–153)
BUN/Creatinine Ratio: 21 (calc) (ref 6–22)
BUN: 24 mg/dL (ref 7–25)
CO2: 23 mmol/L (ref 20–32)
Calcium: 9.4 mg/dL (ref 8.6–10.4)
Chloride: 107 mmol/L (ref 98–110)
Creat: 1.14 mg/dL — ABNORMAL HIGH (ref 0.60–0.93)
GFR, Est African American: 55 mL/min/{1.73_m2} — ABNORMAL LOW (ref >=60)
GFR, Est Non African American: 48 mL/min/{1.73_m2} — ABNORMAL LOW (ref >=60)
Globulin: 2.2 g/dL (calc) (ref 1.9–3.7)
Glucose, Bld: 102 mg/dL — ABNORMAL HIGH (ref 65–99)
Potassium: 4 mmol/L (ref 3.5–5.3)
Sodium: 140 mmol/L (ref 135–146)
Total Bilirubin: 0.9 mg/dL (ref 0.2–1.2)
Total Protein: 6.5 g/dL (ref 6.1–8.1)

## 2019-01-08 LAB — TEST AUTHORIZATION

## 2019-01-08 LAB — CBC
HCT: 38.1 % (ref 35.0–45.0)
Hemoglobin: 13.2 g/dL (ref 11.7–15.5)
MCH: 32.2 pg (ref 27.0–33.0)
MCHC: 34.6 g/dL (ref 32.0–36.0)
MCV: 92.9 fL (ref 80.0–100.0)
MPV: 10.5 fL (ref 7.5–12.5)
Platelets: 213 10*3/uL (ref 140–400)
RBC: 4.1 10*6/uL (ref 3.80–5.10)
RDW: 12.2 % (ref 11.0–15.0)
WBC: 6.4 10*3/uL (ref 3.8–10.8)

## 2019-01-08 LAB — LIPID PANEL
Cholesterol: 128 mg/dL (ref <200)
HDL: 40 mg/dL — AB (ref >=50)
LDL CHOLESTEROL (CALC): 64 mg/dL
Non-HDL Cholesterol (Calc): 88 mg/dL (calc) (ref <130)
TRIGLYCERIDES: 158 mg/dL — AB (ref <150)
Total CHOL/HDL Ratio: 3.2 (calc) (ref <5.0)

## 2019-01-08 LAB — HEMOGLOBIN A1C W/OUT EAG: Hgb A1c MFr Bld: 4.9 % of total Hgb (ref <5.7)

## 2019-01-08 LAB — TSH: TSH: 3.72 mIU/L (ref 0.40–4.50)

## 2019-01-08 NOTE — Progress Notes (Signed)
Subjective:   Suzanne Patrick is a 73 y.o. female who presents for an Initial Medicare Annual Wellness Visit.  Review of Systems    No ROS.  Medicare Wellness Visit. Additional risk factors are reflected in the social history.   Cardiac Risk Factors include: advanced age (>22men, >2 women);sedentary lifestyle;Other (see comment) Sleep patterns: getting 12 hours of sleep a night. Does not wake up during night to go to bathroom. Feels rested upon wakening Home Safety/Smoke Alarms: Feels safe in home. Smoke alarms in place.  Living environment; Lives with husband in 1 story home stairs  In place. Shower is a walk in shower and grab rails in place. Seat Belt Safety/Bike Helmet: Wears seat belt.   Female:   Pap-  Aged out     Mammo-  utd     Dexa scan-  utd      CCS- utd     Objective:    Today's Vitals   01/12/19 1559  BP: 110/60  Pulse: 73  SpO2: 99%  Weight: 125 lb (56.7 kg)  Height: 5\' 4"  (1.626 m)   Body mass index is 21.46 kg/m.  Advanced Directives 01/12/2019 05/22/2016 04/01/2015  Does Patient Have a Medical Advance Directive? Yes Yes Yes  Type of Paramedic of [place];Living will [place] -  Does patient want to make changes to medical advance directive? No - Patient declined - No - Patient declined  Copy of [place] in Chart? No - copy requested No - copy requested -    Current Medications (verified) Outpatient Encounter Medications as of 01/12/2019  Medication Sig  . alendronate (FOSAMAX) 70 MG tablet Take 1 tablet (70 mg total) by mouth every 7 (seven) days.  Marland Kitchen atorvastatin (LIPITOR) 40 MG tablet TAKE 1 TABLET EVERY DAY  . citalopram (CELEXA) 20 MG tablet TAKE ONE TABLET DAILY TO HELP WITH MOOD  . donepezil (ARICEPT) 10 MG tablet Take 1 tablet (10 mg total) by mouth at bedtime.  Marland Kitchen levothyroxine (SYNTHROID, LEVOTHROID) 50 MCG tablet TAKE 1 TABLET DAILY BEFORE BREAKFAST.  . Multiple Vitamins-Minerals  (WOMENS MULTIVITAMIN PO) Take 1 tablet by mouth daily.  . diclofenac sodium (VOLTAREN) 1 % GEL Apply 4 g topically 4 (four) times daily. To affected joint. (Patient not taking: Reported on 01/12/2019)  . [DISCONTINUED] metroNIDAZOLE (FLAGYL) 500 MG tablet Take 1 tablet (500 mg total) by mouth 2 (two) times daily. For 7 days. (Patient not taking: Reported on 01/06/2019)   No facility-administered encounter medications on file as of 01/12/2019.     Allergies (verified) Bee venom   History: Past Medical History:  Diagnosis Date  . Dementia [facility])    Past Surgical History:  Procedure Laterality Date  . BACK SURGERY    . BREAST BIOPSY Right   . BREAST EXCISIONAL BIOPSY Right   . TUBAL LIGATION     Family History  Problem Relation Age of Onset  . Heart attack Father   . Hyperlipidemia Mother    Social History   Socioeconomic History  . Marital status: Married    Spouse name: Waunita Schooner  . Number of children: 2  . Years of education: 63  . Highest education level: 12th grade  Occupational History  . Occupation: Development worker, community business    Comment: retired  Scientific laboratory technician  . Financial resource strain: Not hard at all  . Food insecurity:    Worry: Never true    Inability: Never true  . Transportation needs:  Medical: No    Non-medical: No  Tobacco Use  . Smoking status: Never Smoker  . Smokeless tobacco: Never Used  Substance and Sexual Activity  . Alcohol use: Yes    Alcohol/week: 3.0 standard drinks    Types: 3 Standard drinks or equivalent per week  . Drug use: No  . Sexual activity: Yes    Partners: Male  Lifestyle  . Physical activity:    Days per week: 0 days    Minutes per session: 0 min  . Stress: Not at all  Relationships  . Social connections:    Talks on phone: More than three times a week    Gets together: Three times a week    Attends religious service: More than 4 times per year    Active member of club or organization: No    Attends meetings of clubs or  organizations: Never    Relationship status: Married  Other Topics Concern  . Not on file  Social History Narrative   Has dementia. Likes to ride motorcycles back in her day    Tobacco Counseling Counseling given: No   Clinical Intake:  Pre-visit preparation completed: Yes  Pain : No/denies pain     Nutritional Risks: None Diabetes: No  How often do you need to have someone help you when you read instructions, pamphlets, or other written materials from your doctor or pharmacy?: 1 - Never What is the last grade level you completed in school?: 12  Interpreter Needed?: No  Information entered by :: Orlie Dakin, LPN   Activities of Daily Living In your present state of health, do you have any difficulty performing the following activities: 01/12/2019  Hearing? Y  Comment has hearing aides but not in ears today  Vision? Y  Comment wears glasses  Difficulty concentrating or making decisions? Y  Comment diagnosed with dementia through a program at [facility] about a year ago  Walking or climbing stairs? N  Dressing or bathing? N  Doing errands, shopping? N  Preparing Food and eating ? Y  Comment husband does  Using the Toilet? N  In the past six months, have you accidently leaked urine? N  Do you have problems with loss of bowel control? N  Managing your Medications? Y  Comment husband does this for her  Managing your Finances? Y  Comment husband does  Runner, broadcasting/film/video? Y  Comment husband takes care of this  Some recent data might be hidden     Immunizations and Health Maintenance Immunization History  Administered Date(s) Administered  . Influenza, High Dose Seasonal PF 07/17/2016, 07/23/2017, 09/10/2018  . Influenza,inj,Quad PF,6+ Mos 10/12/2015  . Pneumococcal Conjugate-13 10/12/2015  . Pneumococcal Polysaccharide-23 07/17/2016  . Tdap 07/17/2016   Health Maintenance Due  Topic Date Due  . Hepatitis C Screening  [birth year]     Patient Care Team: Emeterio Reeve, DO as PCP - General (Osteopathic Medicine)  Indicate any recent Medical Services you may have received from other than [facility] providers in the past year (date may be approximate).     Assessment:   This is a routine wellness examination for [place].Physical assessment deferred to PCP.   Hearing/Vision screen Hearing Screening Comments: Whisper test done and patient could not report the 3 words back Vision Screening Comments: Could not do vision exam due to patient did not have her glasses.   Dietary issues and exercise activities discussed: Current Exercise Habits: The patient does not participate in regular exercise at  present, Exercise limited by: psychological condition(s)(dementia) Diet eats a healthy diet- husband makes sure of this. Breakfast:cereal Lunch: boost drink Dinner:  Meat and vegetables. Husband states she is picky at times with the demnetia.     Goals   None    Depression Screen PHQ 2/9 Scores 01/12/2019 03/05/2018 01/31/2017 08/31/2016  PHQ - 2 Score 0 2 6 0  PHQ- 9 Score - 5 22 -    Fall Risk Fall Risk  01/12/2019 08/31/2016  Falls in the past year? 0 No  Risk for fall due to : Mental status change -  Follow up Falls prevention discussed -    Is the patient's home free of loose throw rugs in walkways, pet beds, electrical cords, etc?   yes      Grab bars in the bathroom? yes      Handrails on the stairs?   no      Adequate lighting?   yes   Cognitive Function: MMSE - Mini Mental State Exam 05/10/2017  Orientation to time 3  Orientation to Place 3  Registration 3  Attention/ Calculation 0  Recall 3  Language- name 2 objects 2  Language- repeat 0  Language- follow 3 step command 3  Language- read & follow direction 1  Write a sentence 1  Copy design 1  Total score 20     6CIT Screen 01/12/2019  What Year? 4 points  What month? 3 points  What time? 0 points  Count back from 20 4 points  Months in reverse  (No Data)  Repeat phrase (No Data)    Screening Tests Health Maintenance  Topic Date Due  . Hepatitis C Screening  1946/08/15  . Fecal DNA (Cologuard)  11/08/2019  . MAMMOGRAM  02/27/2020  . TETANUS/TDAP  07/17/2026  . INFLUENZA VACCINE  Completed  . DEXA SCAN  Completed  . PNA vac Low Risk Adult  Completed       Plan:      Ms. Bose , Thank you for taking time to come for your Medicare Wellness Visit. I appreciate your ongoing commitment to your health goals. Please review the following plan we discussed and let me know if I can assist you in the future.   Please schedule your next medicare wellness visit with me in 1 yr.   These are the goals we discussed: Goals   None     This is a list of the screening recommended for you and due dates:  Health Maintenance  Topic Date Due  .  Hepatitis C: One time screening is recommended by Center for Disease Control  (CDC) for  adults born from [birth year] through 1966.   1946/08/17  . Cologuard (Stool DNA test)  11/08/2019  . Mammogram  02/27/2020  . Tetanus Vaccine  07/17/2026  . Flu Shot  Completed  . DEXA scan (bone density measurement)  Completed  . Pneumonia vaccines  Completed     I have personally reviewed and noted the following in the patient's chart:   . Medical and social history . Use of alcohol, tobacco or illicit drugs  . Current medications and supplements . Functional ability and status . Nutritional status . Physical activity . Advanced directives . List of other physicians . Hospitalizations, surgeries, and ER visits in previous 12 months . Vitals . Screenings to include cognitive, depression, and falls . Referrals and appointments  In addition, I have reviewed and discussed with patient certain preventive protocols, quality metrics, and best practice recommendations.  A written personalized care plan for preventive services as well as general preventive health recommendations were provided to patient.      Joanne Chars, LPN   0/01/3532

## 2019-01-12 ENCOUNTER — Ambulatory Visit (INDEPENDENT_AMBULATORY_CARE_PROVIDER_SITE_OTHER): Payer: Medicare HMO | Admitting: *Deleted

## 2019-01-12 VITALS — BP 110/60 | HR 73 | Ht 64.0 in | Wt 125.0 lb

## 2019-01-12 DIAGNOSIS — Z0000 Encounter for general adult medical examination without abnormal findings: Secondary | ICD-10-CM | POA: Diagnosis not present

## 2019-01-12 NOTE — Patient Instructions (Signed)
Suzanne Patrick , Thank you for taking time to come for your Medicare Wellness Visit. I appreciate your ongoing commitment to your health goals. Please review the following plan we discussed and let me know if I can assist you in the future.   Please schedule your next medicare wellness visit with me in 1 yr.

## 2019-01-14 IMAGING — DX DG SHOULDER 2+V*R*
3 series · 3 of 3 positions shown · non-contrast
Comparison: None.

CLINICAL DATA: Right shoulder pain for 1 week.  No specific injury.

EXAM:
RIGHT SHOULDER - 2+ VIEW

[shoulder grashey]
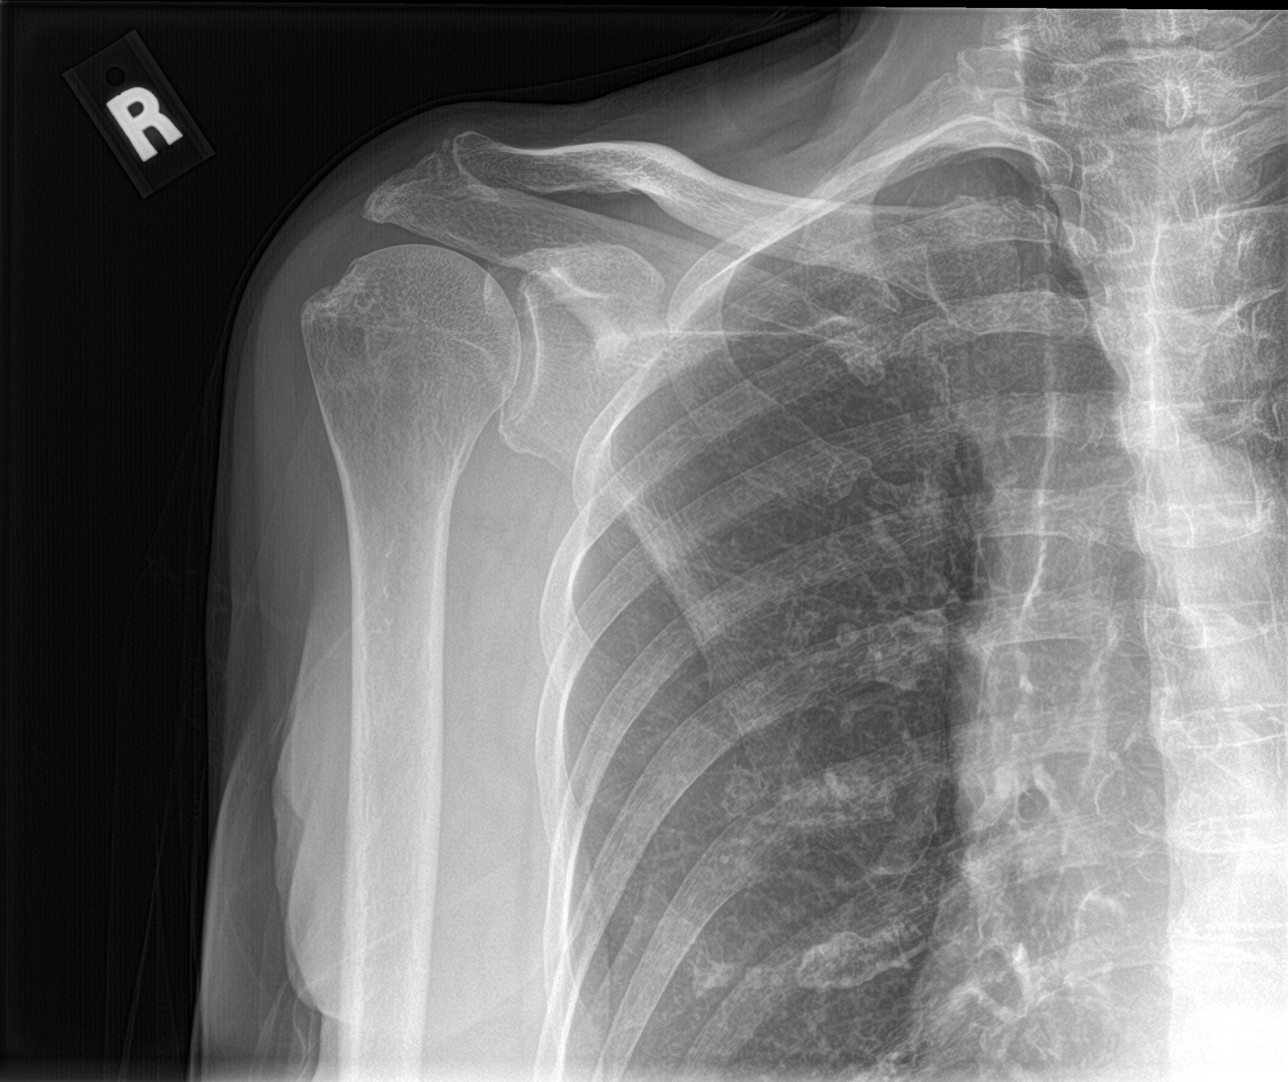

[shoulder y view]
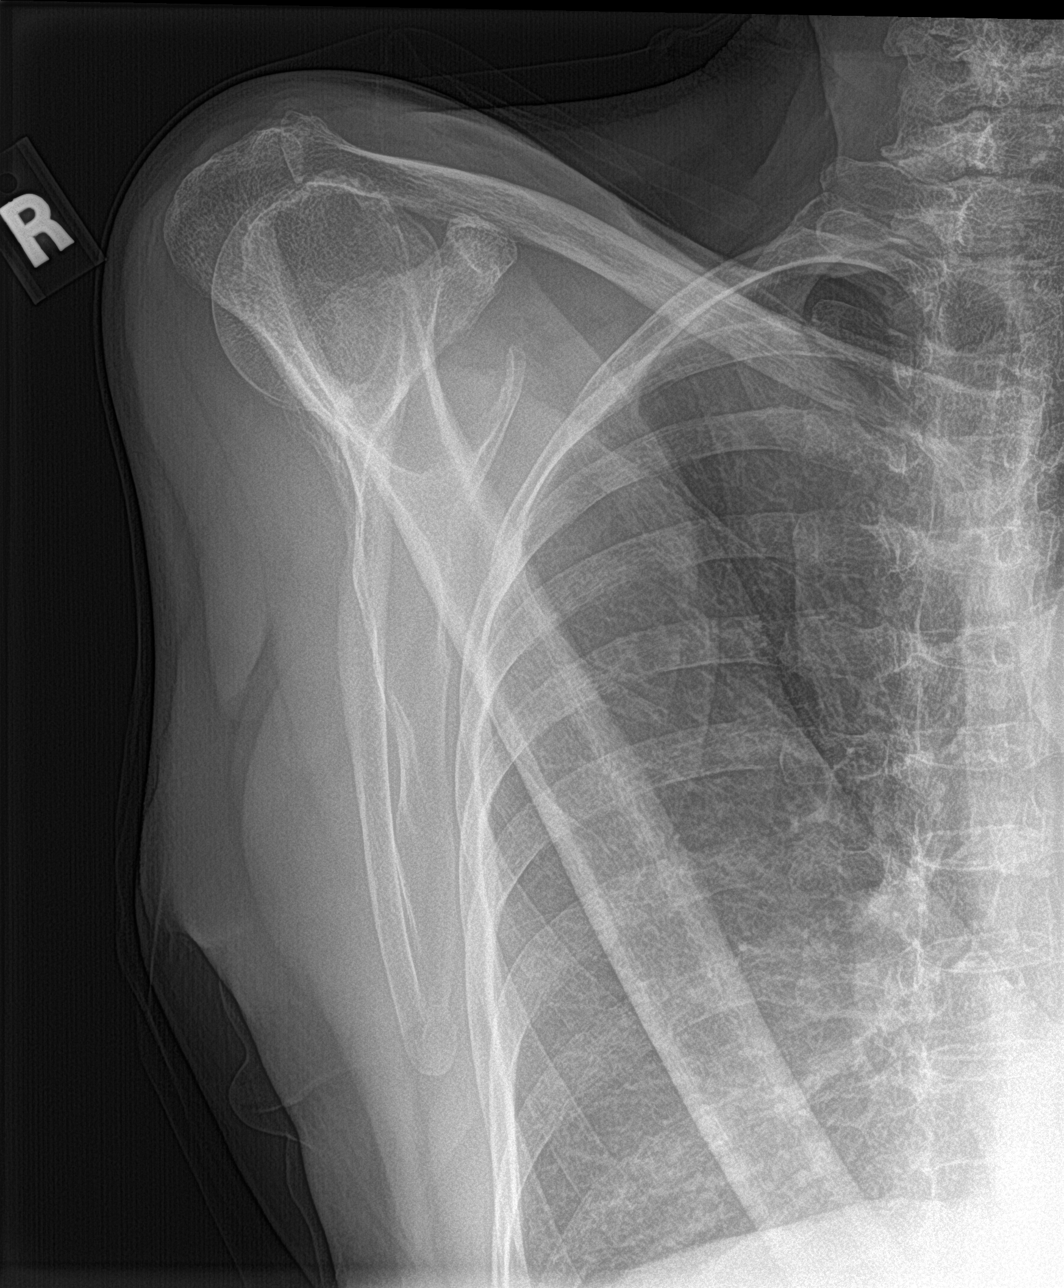

[shoulder axillary]
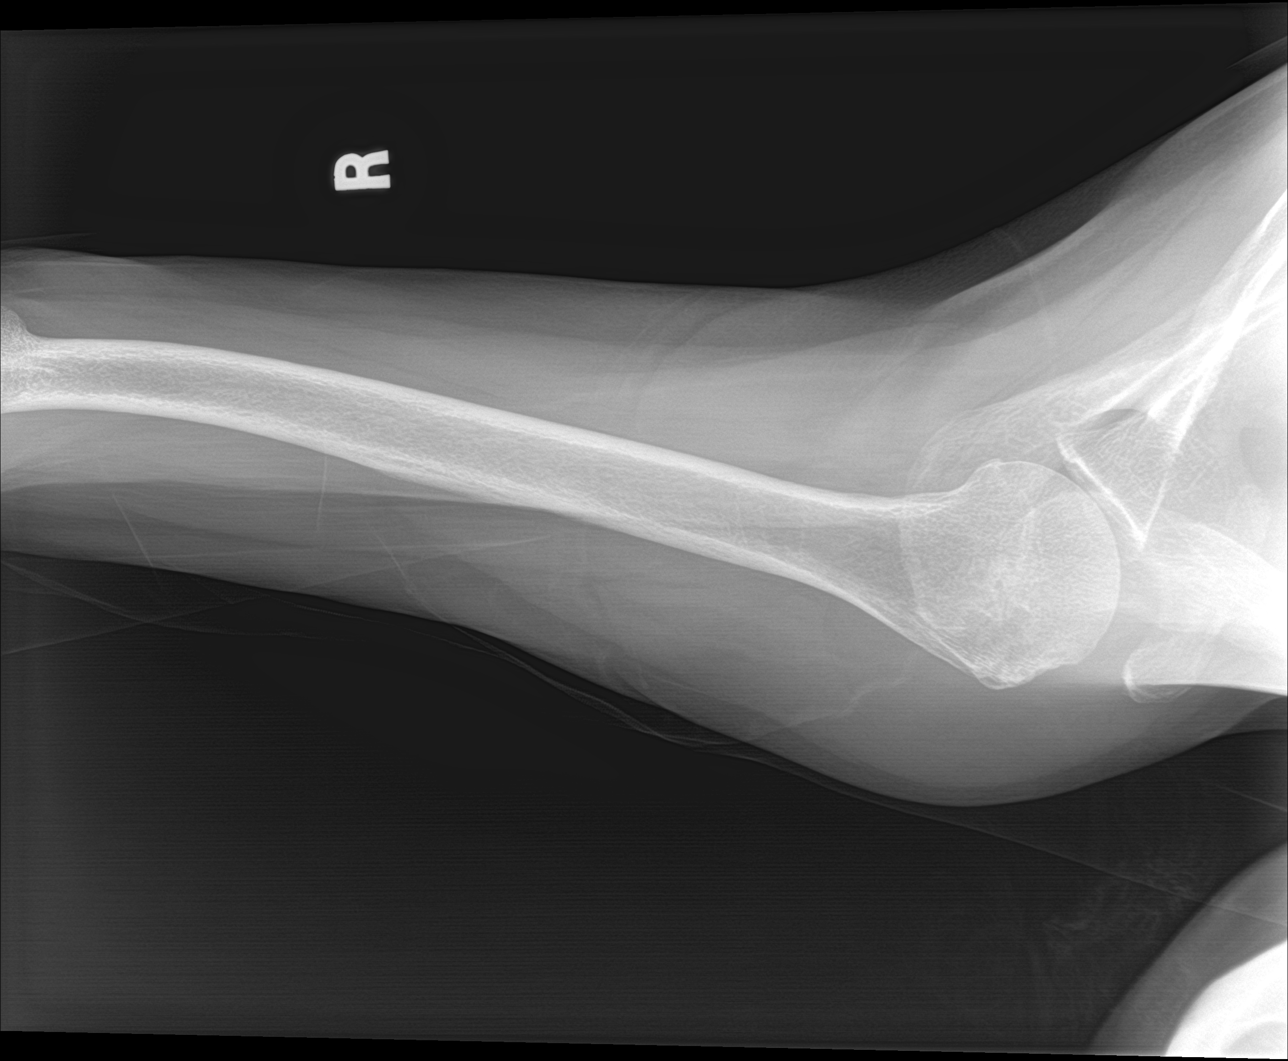

[3 of 3 positions shown; findings below may reference images not displayed]

FINDINGS: Mild AC joint degenerative changes. No acute bony findings or
abnormal soft tissue calcifications. The visualize right lung is
clear and the visualized right ribs are intact.
IMPRESSION: Mild degenerative changes but no acute bony abnormality.

## 2019-03-14 ENCOUNTER — Other Ambulatory Visit: Payer: Self-pay | Admitting: Osteopathic Medicine

## 2019-03-14 DIAGNOSIS — R4189 Other symptoms and signs involving cognitive functions and awareness: Secondary | ICD-10-CM

## 2019-04-14 ENCOUNTER — Telehealth: Payer: Self-pay

## 2019-04-14 ENCOUNTER — Other Ambulatory Visit: Payer: Self-pay

## 2019-04-14 DIAGNOSIS — E039 Hypothyroidism, unspecified: Secondary | ICD-10-CM

## 2019-04-14 MED ORDER — LEVOTHYROXINE SODIUM 50 MCG PO TABS: 50.0000 ug | ORAL_TABLET | Freq: Every day | ORAL | 3 refills | Status: DC

## 2019-04-14 MED ORDER — ATORVASTATIN CALCIUM 40 MG PO TABS: 40.0000 mg | ORAL_TABLET | Freq: Every day | ORAL | 1 refills | Status: DC

## 2019-04-14 NOTE — Telephone Encounter (Signed)
Task completed

## 2019-04-14 NOTE — Telephone Encounter (Signed)
[facility] pharmacy requesting #90 w/refills for levothyroxine. Last thyroid check was 01/06/19, wnl. Pls advise, thanks.

## 2019-04-14 NOTE — Telephone Encounter (Signed)
Ok to refill #90 w/ 3 refills

## 2019-06-09 ENCOUNTER — Other Ambulatory Visit: Payer: Self-pay | Admitting: Osteopathic Medicine

## 2019-06-09 DIAGNOSIS — R4189 Other symptoms and signs involving cognitive functions and awareness: Secondary | ICD-10-CM

## 2019-07-16 ENCOUNTER — Other Ambulatory Visit: Payer: Self-pay

## 2019-07-16 MED ORDER — CITALOPRAM HYDROBROMIDE 20 MG PO TABS: ORAL_TABLET | ORAL | 1 refills | Status: DC

## 2019-07-21 ENCOUNTER — Other Ambulatory Visit: Payer: Self-pay | Admitting: Osteopathic Medicine

## 2019-08-12 ENCOUNTER — Ambulatory Visit (INDEPENDENT_AMBULATORY_CARE_PROVIDER_SITE_OTHER): Payer: Medicare HMO | Admitting: Physician Assistant

## 2019-08-12 DIAGNOSIS — Z23 Encounter for immunization: Secondary | ICD-10-CM | POA: Diagnosis not present

## 2019-09-21 ENCOUNTER — Other Ambulatory Visit: Payer: Self-pay | Admitting: Osteopathic Medicine

## 2019-09-21 ENCOUNTER — Other Ambulatory Visit: Payer: Self-pay

## 2019-09-21 DIAGNOSIS — R4189 Other symptoms and signs involving cognitive functions and awareness: Secondary | ICD-10-CM

## 2019-09-21 MED ORDER — DONEPEZIL HCL 10 MG PO TABS: ORAL_TABLET | ORAL | 1 refills | Status: DC

## 2019-11-26 ENCOUNTER — Encounter: Payer: Self-pay | Admitting: Osteopathic Medicine

## 2019-12-15 ENCOUNTER — Other Ambulatory Visit: Payer: Self-pay | Admitting: Osteopathic Medicine

## 2019-12-15 NOTE — Telephone Encounter (Signed)
Requested medication (s) are due for refill today: yes  Requested medication (s) are on the active medication list: yes  Last refill: 10/01/2019  Future visit scheduled: no  Notes to clinic: review for refill   Requested Prescriptions  Pending Prescriptions Disp Refills   citalopram (CELEXA) 20 MG tablet [Pharmacy Med Name: CITALOPRAM HYDROBROMIDE 20 MG Tablet] 90 tablet 1    Sig: Take one tab daily to help with mood.      Psychiatry:  Antidepressants - SSRI Failed - 12/15/2019  2:57 AM      Failed - Valid encounter within last 6 months    Recent Outpatient Visits           11 months ago Hypothyroidism, unspecified type   [facility], [place], DO   1 year ago Fall in home, initial encounter   [facility], Lanelle Bal, DO   1 year ago Cyst of right eyelid   [place], Lanelle Bal, DO   1 year ago [place], Royetta Car, PA-C   1 year ago Hypothyroidism, unspecified type   [facility], [place], DO              Passed - Completed PHQ-2 or PHQ-9 in the last 360 days.

## 2020-02-19 ENCOUNTER — Other Ambulatory Visit: Payer: Self-pay | Admitting: Osteopathic Medicine

## 2020-02-19 DIAGNOSIS — E039 Hypothyroidism, unspecified: Secondary | ICD-10-CM

## 2020-02-19 MED ORDER — LEVOTHYROXINE SODIUM 50 MCG PO TABS: 50.0000 ug | ORAL_TABLET | Freq: Every day | ORAL | 0 refills | Status: DC

## 2020-02-23 ENCOUNTER — Other Ambulatory Visit: Payer: Self-pay

## 2020-02-23 DIAGNOSIS — E039 Hypothyroidism, unspecified: Secondary | ICD-10-CM

## 2020-02-23 MED ORDER — LEVOTHYROXINE SODIUM 50 MCG PO TABS: 50.0000 ug | ORAL_TABLET | Freq: Every day | ORAL | 0 refills | Status: DC

## 2020-02-27 ENCOUNTER — Other Ambulatory Visit: Payer: Self-pay | Admitting: Osteopathic Medicine

## 2020-03-03 ENCOUNTER — Ambulatory Visit: Payer: Medicare HMO | Admitting: Osteopathic Medicine

## 2020-03-04 ENCOUNTER — Ambulatory Visit (INDEPENDENT_AMBULATORY_CARE_PROVIDER_SITE_OTHER): Payer: Medicare HMO | Admitting: Osteopathic Medicine

## 2020-03-04 ENCOUNTER — Encounter: Payer: Self-pay | Admitting: Osteopathic Medicine

## 2020-03-04 ENCOUNTER — Other Ambulatory Visit: Payer: Self-pay

## 2020-03-04 VITALS — BP 104/70 | HR 82 | Temp 97.9°F | Wt 124.0 lb

## 2020-03-04 DIAGNOSIS — Z1231 Encounter for screening mammogram for malignant neoplasm of breast: Secondary | ICD-10-CM

## 2020-03-04 DIAGNOSIS — K219 Gastro-esophageal reflux disease without esophagitis: Secondary | ICD-10-CM | POA: Diagnosis not present

## 2020-03-04 DIAGNOSIS — Z1211 Encounter for screening for malignant neoplasm of colon: Secondary | ICD-10-CM

## 2020-03-04 DIAGNOSIS — E782 Mixed hyperlipidemia: Secondary | ICD-10-CM | POA: Diagnosis not present

## 2020-03-04 DIAGNOSIS — E039 Hypothyroidism, unspecified: Secondary | ICD-10-CM

## 2020-03-04 DIAGNOSIS — R4189 Other symptoms and signs involving cognitive functions and awareness: Secondary | ICD-10-CM

## 2020-03-04 DIAGNOSIS — R7309 Other abnormal glucose: Secondary | ICD-10-CM | POA: Diagnosis not present

## 2020-03-04 DIAGNOSIS — M8580 Other specified disorders of bone density and structure, unspecified site: Secondary | ICD-10-CM

## 2020-03-04 MED ORDER — ALENDRONATE SODIUM 70 MG PO TABS: 70.0000 mg | ORAL_TABLET | ORAL | 3 refills | Status: DC

## 2020-03-04 MED ORDER — LEVOTHYROXINE SODIUM 50 MCG PO TABS: 50.0000 ug | ORAL_TABLET | Freq: Every day | ORAL | 3 refills | Status: DC

## 2020-03-04 MED ORDER — DONEPEZIL HCL 10 MG PO TABS: ORAL_TABLET | ORAL | 3 refills | Status: DC

## 2020-03-04 MED ORDER — CITALOPRAM HYDROBROMIDE 20 MG PO TABS: 20.0000 mg | ORAL_TABLET | Freq: Every day | ORAL | 3 refills | Status: DC

## 2020-03-04 MED ORDER — ATORVASTATIN CALCIUM 40 MG PO TABS: 40.0000 mg | ORAL_TABLET | Freq: Every day | ORAL | 3 refills | Status: DC

## 2020-03-04 NOTE — Progress Notes (Signed)
Suzanne Patrick is a 74 y.o. female who presents to  [place] at [facility]  today, 03/04/20, seeking care for the following: . Check-up, see below . No new concerns today . Needs refills . Needs labs      ASSESSMENT & PLAN with other pertinent history/findings:  The primary encounter diagnosis was Hypothyroidism, unspecified type. Diagnoses of Cognitive impairment, Osteopenia determined by x-ray, Mixed hyperlipidemia, Gastroesophageal reflux disease, unspecified whether esophagitis present, Encounter for screening mammogram for malignant neoplasm of breast, and Screen for colon cancer were also pertinent to this visit.    Patient Instructions  General Preventive Care  Most recent routine screening labs: ordered today   Everyone should have blood pressure checked once per year. Goal 140/90 or less.   Tobacco: don't!   Alcohol: not recommended / rare use!   Exercise: as tolerated to reduce risk of cardiovascular disease and diabetes. Strength training will also prevent osteoporosis.   Mental health: if need for mental health care (medicines, counseling, other), or concerns about moods, please let me know!   Sexual health: if need for STD testing, or if concerns with libido/pain problems, please let me know!   Advanced Directive: Living Will and/or Healthcare Power of Attorney recommended for all adults, regardless of age or health.  Vaccines  Flu vaccine: for almost everyone, every fall.   Shingles vaccine: after age 27. Medicare will only pay for this at your pharmacy - please ask your pharmacist!   Pneumonia vaccines: all done!   Tetanus booster: not until 2027  COVID vaccine: thanks for taking the vaccine!  Cancer screenings   Colon cancer screening:due for repeat Cologuard test.   Breast cancer screening: mammogram due.   Cervical cancer screening: Pap not needed  Lung cancer screening: not needed  Infection  screenings  . HIV: recommended screening at least once age 77-65 . Gonorrhea/Chlamydia: screening as needed. . Hepatitis C: recommended once for everyone age 71-75 . TB: certain at-risk populations, or depending on work requirements and/or travel history Other . Bone Density Test: recommended every other year     Orders Placed This Encounter  Procedures  . MM 3D SCREEN BREAST BILATERAL  . DG Bone Density  . CBC  . COMPLETE METABOLIC PANEL WITH GFR  . Lipid panel  . Hemoglobin A1c  . TSH  . Cologuard    Meds ordered this encounter  Medications  . alendronate (FOSAMAX) 70 MG tablet    Sig: Take 1 tablet (70 mg total) by mouth once a week. Take with a full glass of water on an empty stomach.    Dispense:  12 tablet    Refill:  3  . atorvastatin (LIPITOR) 40 MG tablet    Sig: Take 1 tablet (40 mg total) by mouth daily.    Dispense:  90 tablet    Refill:  3  . citalopram (CELEXA) 20 MG tablet    Sig: Take 1 tablet (20 mg total) by mouth daily.    Dispense:  90 tablet    Refill:  3  . donepezil (ARICEPT) 10 MG tablet    Sig: TAKE 1 TABLET AT BEDTIME. CANCEL 5MG  DOSE.    Dispense:  90 tablet    Refill:  3  . levothyroxine (SYNTHROID) 50 MCG tablet    Sig: Take 1 tablet (50 mcg total) by mouth daily before breakfast.    Dispense:  90 tablet    Refill:  3  Follow-up instructions: Return in about 1 year (around 03/04/2021) for [place] (call week prior to visit for lab orders).                                         BP 104/70 (BP Location: Left Arm, Patient Position: Sitting, Cuff Size: Normal)   Pulse 82   Temp 97.9 F (36.6 C) (Oral)   Wt 124 lb 0.6 oz (56.3 kg)   BMI 21.29 kg/m   Current Meds  Medication Sig  . Multiple Vitamins-Minerals (WOMENS MULTIVITAMIN PO) Take 1 tablet by mouth daily.  . [DISCONTINUED] alendronate (FOSAMAX) 70 MG tablet TAKE 1 TABLET EVERY 7 DAYS   . [DISCONTINUED] atorvastatin (LIPITOR)  40 MG tablet TAKE 1 TABLET (40 MG TOTAL) BY MOUTH DAILY.  . [DISCONTINUED] donepezil (ARICEPT) 10 MG tablet TAKE 1 TABLET AT BEDTIME. CANCEL 5MG  DOSE.  . [DISCONTINUED] levothyroxine (SYNTHROID) 50 MCG tablet Take 1 tablet (50 mcg total) by mouth daily before breakfast. Appointment needed for refills    No results found for this or any previous visit (from the past 72 hour(s)).  No results found.  Depression screen [facility] 2/9 01/12/2019 03/05/2018 01/31/2017  Decreased Interest 0 0 3  Down, Depressed, Hopeless 0 2 3  PHQ - 2 Score 0 2 6  Altered sleeping - 0 2  Tired, decreased energy - 1 3  Change in appetite - 0 3  Feeling bad or failure about yourself  - 2 3  Trouble concentrating - 0 2  Moving slowly or fidgety/restless - 0 2  Suicidal thoughts - 0 1  PHQ-9 Score - 5 22  Difficult doing work/chores - Somewhat difficult -    GAD 7 : Generalized Anxiety Score 03/05/2018  Nervous, Anxious, on Edge 0  Control/stop worrying 0  Worry too much - different things 2  Trouble relaxing 0  Restless 0  Easily annoyed or irritable 2  Afraid - awful might happen 0  Total GAD 7 Score 4  Anxiety Difficulty Not difficult at all      All questions at time of visit were answered - patient instructed to contact office with any additional concerns or updates.  ER/RTC precautions were reviewed with the patient.  Please note: voice recognition software was used to produce this document, and typos may escape review. Please contact Dr. Sheppard Coil for any needed clarifications.

## 2020-03-04 NOTE — Patient Instructions (Addendum)
General Preventive Care  Most recent routine screening labs: ordered today   Everyone should have blood pressure checked once per year. Goal 140/90 or less.   Tobacco: don't!   Alcohol: not recommended / rare use!   Exercise: as tolerated to reduce risk of cardiovascular disease and diabetes. Strength training will also prevent osteoporosis.   Mental health: if need for mental health care (medicines, counseling, other), or concerns about moods, please let me know!   Sexual health: if need for STD testing, or if concerns with libido/pain problems, please let me know!   Advanced Directive: Living Will and/or Healthcare Power of Attorney recommended for all adults, regardless of age or health.  Vaccines  Flu vaccine: for almost everyone, every fall.   Shingles vaccine: after age 72. Medicare will only pay for this at your pharmacy - please ask your pharmacist!   Pneumonia vaccines: all done!   Tetanus booster: not until 2027  COVID vaccine: thanks for taking the vaccine!  Cancer screenings   Colon cancer screening:due for repeat Cologuard test.   Breast cancer screening: mammogram due.   Cervical cancer screening: Pap not needed  Lung cancer screening: not needed  Infection screenings  . HIV: recommended screening at least once age 63-65 . Gonorrhea/Chlamydia: screening as needed. . Hepatitis C: recommended once for everyone age 35-75 . TB: certain at-risk populations, or depending on work requirements and/or travel history Other . Bone Density Test: recommended every other year

## 2020-03-05 LAB — COMPLETE METABOLIC PANEL WITH GFR
AG Ratio: 2 (calc) (ref 1.0–2.5)
ALT: 31 U/L — ABNORMAL HIGH (ref 6–29)
AST: 28 U/L (ref 10–35)
Albumin: 4.4 g/dL (ref 3.6–5.1)
Alkaline phosphatase (APISO): 69 U/L (ref 37–153)
BUN/Creatinine Ratio: 21 (calc) (ref 6–22)
BUN: 22 mg/dL (ref 7–25)
CO2: 21 mmol/L (ref 20–32)
Calcium: 9.4 mg/dL (ref 8.6–10.4)
Chloride: 107 mmol/L (ref 98–110)
Creat: 1.07 mg/dL — ABNORMAL HIGH (ref 0.60–0.93)
GFR, Est African American: 59 mL/min/{1.73_m2} — ABNORMAL LOW (ref >=60)
GFR, Est Non African American: 51 mL/min/{1.73_m2} — ABNORMAL LOW (ref >=60)
Globulin: 2.2 g/dL (calc) (ref 1.9–3.7)
Glucose, Bld: 99 mg/dL (ref 65–139)
Potassium: 3.9 mmol/L (ref 3.5–5.3)
Sodium: 139 mmol/L (ref 135–146)
Total Bilirubin: 0.7 mg/dL (ref 0.2–1.2)
Total Protein: 6.6 g/dL (ref 6.1–8.1)

## 2020-03-05 LAB — TSH: TSH: 2.22 mIU/L (ref 0.40–4.50)

## 2020-03-05 LAB — LIPID PANEL
Cholesterol: 131 mg/dL (ref <200)
HDL: 46 mg/dL — ABNORMAL LOW (ref >=50)
LDL Cholesterol (Calc): 67 mg/dL (calc)
Non-HDL Cholesterol (Calc): 85 mg/dL (calc) (ref <130)
Total CHOL/HDL Ratio: 2.8 (calc) (ref <5.0)
Triglycerides: 101 mg/dL (ref <150)

## 2020-03-05 LAB — CBC
HCT: 38.3 % (ref 35.0–45.0)
Hemoglobin: 13.2 g/dL (ref 11.7–15.5)
MCH: 32 pg (ref 27.0–33.0)
MCHC: 34.5 g/dL (ref 32.0–36.0)
MCV: 92.7 fL (ref 80.0–100.0)
MPV: 10.8 fL (ref 7.5–12.5)
Platelets: 227 10*3/uL (ref 140–400)
RBC: 4.13 10*6/uL (ref 3.80–5.10)
RDW: 12.3 % (ref 11.0–15.0)
WBC: 8.7 10*3/uL (ref 3.8–10.8)

## 2020-03-05 LAB — HEMOGLOBIN A1C
Hgb A1c MFr Bld: 5.1 % of total Hgb (ref <5.7)
Mean Plasma Glucose: 100 (calc)
eAG (mmol/L): 5.5 (calc)

## 2020-04-03 ENCOUNTER — Other Ambulatory Visit: Payer: Self-pay | Admitting: Osteopathic Medicine

## 2020-04-05 ENCOUNTER — Other Ambulatory Visit: Payer: Self-pay

## 2020-04-05 MED ORDER — DONEPEZIL HCL 10 MG PO TABS: ORAL_TABLET | ORAL | 0 refills | Status: DC

## 2020-04-06 ENCOUNTER — Ambulatory Visit (INDEPENDENT_AMBULATORY_CARE_PROVIDER_SITE_OTHER): Payer: Medicare HMO

## 2020-04-06 ENCOUNTER — Other Ambulatory Visit: Payer: Self-pay

## 2020-04-06 DIAGNOSIS — Z780 Asymptomatic menopausal state: Secondary | ICD-10-CM | POA: Diagnosis not present

## 2020-04-06 DIAGNOSIS — M810 Age-related osteoporosis without current pathological fracture: Secondary | ICD-10-CM | POA: Diagnosis not present

## 2020-04-06 DIAGNOSIS — Z1231 Encounter for screening mammogram for malignant neoplasm of breast: Secondary | ICD-10-CM | POA: Diagnosis not present

## 2020-04-06 DIAGNOSIS — M8580 Other specified disorders of bone density and structure, unspecified site: Secondary | ICD-10-CM | POA: Diagnosis not present

## 2020-04-07 ENCOUNTER — Telehealth: Payer: Self-pay

## 2020-04-07 DIAGNOSIS — Z8639 Personal history of other endocrine, nutritional and metabolic disease: Secondary | ICD-10-CM

## 2020-04-07 NOTE — Telephone Encounter (Signed)
[facility] billing trailer for HgbA1c.

## 2020-04-19 ENCOUNTER — Telehealth: Payer: Self-pay

## 2020-04-19 DIAGNOSIS — M810 Age-related osteoporosis without current pathological fracture: Secondary | ICD-10-CM

## 2020-04-19 NOTE — Telephone Encounter (Signed)
I will check PA status.

## 2020-04-19 NOTE — Telephone Encounter (Signed)
-----   Message from Mertha Finders, [place] sent at 04/07/2020  3:47 PM EDT ----- Pt viewed results and provider's note via MyChart.   Levada Dy can you please obtain the prior authorization for pt? Thanks in advance.

## 2020-05-11 ENCOUNTER — Other Ambulatory Visit: Payer: Self-pay

## 2020-05-11 DIAGNOSIS — F32A Depression, unspecified: Secondary | ICD-10-CM

## 2020-05-11 MED ORDER — CITALOPRAM HYDROBROMIDE 20 MG PO TABS: 20.0000 mg | ORAL_TABLET | Freq: Every day | ORAL | 3 refills | Status: DC

## 2020-05-11 NOTE — Telephone Encounter (Signed)
Last refill-03/04/2020 Last Ov- 03/04/2020 Patient's husbands says that [place] told them the RX was canceled, or no refills.

## 2020-05-16 ENCOUNTER — Other Ambulatory Visit: Payer: Self-pay | Admitting: Osteopathic Medicine

## 2020-05-17 ENCOUNTER — Other Ambulatory Visit: Payer: Self-pay

## 2020-05-17 DIAGNOSIS — E782 Mixed hyperlipidemia: Secondary | ICD-10-CM

## 2020-05-17 MED ORDER — LEVOTHYROXINE SODIUM 50 MCG PO TABS: 50.0000 ug | ORAL_TABLET | Freq: Every day | ORAL | 3 refills | Status: DC

## 2020-05-31 NOTE — Telephone Encounter (Signed)
Started PA for Prolia. Please add osteoporosis to problem list.

## 2020-06-01 DIAGNOSIS — M810 Age-related osteoporosis without current pathological fracture: Secondary | ICD-10-CM | POA: Insufficient documentation

## 2020-06-01 NOTE — Telephone Encounter (Signed)
Done

## 2020-06-02 NOTE — Telephone Encounter (Signed)
Unable to leave a message, no voicemail.  

## 2020-06-02 NOTE — Telephone Encounter (Signed)
PA approved for Prolia

## 2020-06-16 ENCOUNTER — Other Ambulatory Visit: Payer: Self-pay | Admitting: Osteopathic Medicine

## 2020-06-24 NOTE — Telephone Encounter (Signed)
Labs ordered and patient scheduled  

## 2020-06-27 DIAGNOSIS — M810 Age-related osteoporosis without current pathological fracture: Secondary | ICD-10-CM | POA: Diagnosis not present

## 2020-06-28 LAB — COMPLETE METABOLIC PANEL WITH GFR
AG Ratio: 2 (calc) (ref 1.0–2.5)
ALT: 34 U/L — ABNORMAL HIGH (ref 6–29)
AST: 30 U/L (ref 10–35)
Albumin: 4.2 g/dL (ref 3.6–5.1)
Alkaline phosphatase (APISO): 62 U/L (ref 37–153)
BUN/Creatinine Ratio: 20 (calc) (ref 6–22)
BUN: 25 mg/dL (ref 7–25)
CO2: 23 mmol/L (ref 20–32)
Calcium: 9.3 mg/dL (ref 8.6–10.4)
Chloride: 108 mmol/L (ref 98–110)
Creat: 1.22 mg/dL — ABNORMAL HIGH (ref 0.60–0.93)
GFR, Est African American: 51 mL/min/{1.73_m2} — ABNORMAL LOW (ref >=60)
GFR, Est Non African American: 44 mL/min/{1.73_m2} — ABNORMAL LOW (ref >=60)
Globulin: 2.1 g/dL (calc) (ref 1.9–3.7)
Glucose, Bld: 99 mg/dL (ref 65–99)
Potassium: 3.7 mmol/L (ref 3.5–5.3)
Sodium: 140 mmol/L (ref 135–146)
Total Bilirubin: 0.7 mg/dL (ref 0.2–1.2)
Total Protein: 6.3 g/dL (ref 6.1–8.1)

## 2020-06-29 ENCOUNTER — Ambulatory Visit (INDEPENDENT_AMBULATORY_CARE_PROVIDER_SITE_OTHER): Payer: Medicare HMO | Admitting: Osteopathic Medicine

## 2020-06-29 ENCOUNTER — Encounter: Payer: Self-pay | Admitting: Osteopathic Medicine

## 2020-06-29 ENCOUNTER — Other Ambulatory Visit: Payer: Self-pay

## 2020-06-29 VITALS — BP 97/67 | HR 72 | Temp 97.6°F | Wt 120.0 lb

## 2020-06-29 DIAGNOSIS — R4189 Other symptoms and signs involving cognitive functions and awareness: Secondary | ICD-10-CM

## 2020-06-29 DIAGNOSIS — M810 Age-related osteoporosis without current pathological fracture: Secondary | ICD-10-CM

## 2020-06-29 MED ORDER — DENOSUMAB 60 MG/ML ~~LOC~~ SOSY
60.0000 mg | PREFILLED_SYRINGE | Freq: Once | SUBCUTANEOUS | Status: AC
  Administered 2020-06-29: 60 mg via SUBCUTANEOUS

## 2020-06-29 NOTE — Patient Instructions (Signed)
Prolia injection today  Will get MRI brain and follow up from there Will fill out forms for [place] assistance

## 2020-06-29 NOTE — Progress Notes (Signed)
Suzanne Patrick is a 74 y.o. female who presents to  [place] at [facility]  today, 06/29/20, seeking care for the following:  Marland Kitchen Memory issues ongoing for several years but has been notes getting significantly worse over the past 6 months or so to the point where she can no longer cook, clean, drive, take medications without help, perform other ADLs without help.  She must be told when to eat or she will eat anything.  When she is dressing herself, she will do things like forget that she has already put on underwear, will put on multiple pairs of underwear and socks.  She is becoming resistant to bathing herself.  Is not remembering family members other than her husband and sister.  Talking to people who are not there.  Patient is not at all oriented to anything other than person/self.  Unable to complete clock drawing beyond a basic circle but has to be told to press the pen to the paper.  Patient is alert, pleasant, interactive, does not appear agitated, no history of agitation/combativeness.  Husband at this point feels fairly able to take care of her but is seeking assistance from the [place] to help with home tasks.  At this point, he gets maybe a half a day off per week but otherwise the patient requires constant supervision.     ASSESSMENT & PLAN with other pertinent findings:  The primary encounter diagnosis was Cognitive impairment. A diagnosis of Osteoporosis without current pathological fracture, unspecified osteoporosis type was also pertinent to this visit.   No results found for this or any previous visit (from the past 24 hour(s)).  Advised patient and husband that I will do what I can terms of getting home assistance, it sounds like we might be approaching a point at which husband might not be able to provide full-time care, or there needs to be a backup in place in case he himself experiences illness or injury.  He states he will talk it  over a bit more with family.   Patient Instructions  Prolia injection today  Will get MRI brain and follow up from there Will fill out forms for [facility] assistance      Orders Placed This Encounter  Procedures  . MR Brain W Wo Contrast    Meds ordered this encounter  Medications  . denosumab (PROLIA) injection 60 mg       Follow-up instructions: Return for RECHECK PENDING RESULTS / IF WORSE OR CHANGE.                                         BP 97/67 (BP Location: Left Arm, Patient Position: Sitting, Cuff Size: Normal)   Pulse 72   Temp 97.6 F (36.4 C) (Oral)   Wt 120 lb 0.6 oz (54.4 kg)   BMI 20.60 kg/m   Current Meds  Medication Sig  . alendronate (FOSAMAX) 70 MG tablet TAKE 1 TABLET EVERY 7 DAYS  . atorvastatin (LIPITOR) 40 MG tablet TAKE 1 TABLET (40 MG TOTAL) BY MOUTH DAILY.  . citalopram (CELEXA) 20 MG tablet Take 1 tablet (20 mg total) by mouth daily.  Marland Kitchen donepezil (ARICEPT) 10 MG tablet TAKE 1 TABLET AT BEDTIME (NEED MD APPOINTMENT)  . levothyroxine (SYNTHROID) 50 MCG tablet Take 1 tablet (50 mcg total) by mouth daily before breakfast.  . Multiple Vitamins-Minerals (WOMENS  MULTIVITAMIN PO) Take 1 tablet by mouth daily.    Results for orders placed or performed in visit on 04/19/20 (from the past 72 hour(s))  COMPLETE METABOLIC PANEL WITH GFR     Status: Abnormal   Collection Time: 06/27/20  2:47 PM  Result Value Ref Range   Glucose, Bld 99 65 - 99 mg/dL    Comment: .            Fasting reference interval .    BUN 25 7 - 25 mg/dL   Creat 1.22 (H) 0.60 - 0.93 mg/dL    Comment: For patients >71 years of age, the reference limit for Creatinine is approximately 13% higher for people identified as African-American. .    GFR, Est Non African American 44 (L) > OR = 60 mL/min/1.95m2   GFR, Est African American 51 (L) > OR = 60 mL/min/1.16m2   BUN/Creatinine Ratio 20 6 - 22 (calc)   Sodium 140 135 - 146 mmol/L   Potassium  3.7 3.5 - 5.3 mmol/L   Chloride 108 98 - 110 mmol/L   CO2 23 20 - 32 mmol/L   Calcium 9.3 8.6 - 10.4 mg/dL   Total Protein 6.3 6.1 - 8.1 g/dL   Albumin 4.2 3.6 - 5.1 g/dL   Globulin 2.1 1.9 - 3.7 g/dL (calc)   AG Ratio 2.0 1.0 - 2.5 (calc)   Total Bilirubin 0.7 0.2 - 1.2 mg/dL   Alkaline phosphatase (APISO) 62 37 - 153 U/L   AST 30 10 - 35 U/L   ALT 34 (H) 6 - 29 U/L    No results found.     All questions at time of visit were answered - patient instructed to contact office with any additional concerns or updates.  ER/RTC precautions were reviewed with the patient as applicable.   Please note: voice recognition software was used to produce this document, and typos may escape review. Please contact Dr. Sheppard Coil for any needed clarifications.   Total encounter time: 30 minutes.

## 2020-07-01 ENCOUNTER — Other Ambulatory Visit: Payer: Self-pay | Admitting: Osteopathic Medicine

## 2020-07-01 MED ORDER — DIAZEPAM 5 MG PO TABS
5.0000 mg | ORAL_TABLET | Freq: Once | ORAL | 0 refills | Status: DC | PRN
Start: 2020-07-01 — End: 2020-12-30

## 2020-07-01 NOTE — Progress Notes (Signed)
MRI premedication

## 2020-07-04 ENCOUNTER — Ambulatory Visit (INDEPENDENT_AMBULATORY_CARE_PROVIDER_SITE_OTHER): Payer: Medicare HMO

## 2020-07-04 ENCOUNTER — Other Ambulatory Visit: Payer: Self-pay

## 2020-07-04 DIAGNOSIS — J320 Chronic maxillary sinusitis: Secondary | ICD-10-CM | POA: Diagnosis not present

## 2020-07-04 DIAGNOSIS — R4182 Altered mental status, unspecified: Secondary | ICD-10-CM | POA: Diagnosis not present

## 2020-07-04 DIAGNOSIS — R4189 Other symptoms and signs involving cognitive functions and awareness: Secondary | ICD-10-CM | POA: Diagnosis not present

## 2020-07-04 DIAGNOSIS — I6782 Cerebral ischemia: Secondary | ICD-10-CM | POA: Diagnosis not present

## 2020-07-04 MED ORDER — GADOBUTROL 1 MMOL/ML IV SOLN
5.0000 mL | Freq: Once | INTRAVENOUS | Status: AC | PRN
Start: 2020-07-04 — End: 2020-07-04
  Administered 2020-07-04: 7.5 mL via INTRAVENOUS

## 2020-07-05 ENCOUNTER — Other Ambulatory Visit: Payer: Medicare HMO

## 2020-08-29 ENCOUNTER — Other Ambulatory Visit: Payer: Self-pay | Admitting: Osteopathic Medicine

## 2020-09-16 ENCOUNTER — Other Ambulatory Visit: Payer: Self-pay | Admitting: Osteopathic Medicine

## 2020-11-11 ENCOUNTER — Other Ambulatory Visit: Payer: Self-pay | Admitting: Osteopathic Medicine

## 2020-12-02 ENCOUNTER — Telehealth: Payer: Self-pay | Admitting: General Practice

## 2020-12-26 ENCOUNTER — Other Ambulatory Visit: Payer: Self-pay | Admitting: Osteopathic Medicine

## 2020-12-30 ENCOUNTER — Encounter: Payer: Self-pay | Admitting: Osteopathic Medicine

## 2020-12-30 ENCOUNTER — Other Ambulatory Visit: Payer: Self-pay

## 2020-12-30 ENCOUNTER — Ambulatory Visit (INDEPENDENT_AMBULATORY_CARE_PROVIDER_SITE_OTHER): Payer: Medicare HMO | Admitting: Osteopathic Medicine

## 2020-12-30 VITALS — BP 102/67 | HR 76 | Temp 98.3°F | Resp 20 | Ht 64.0 in | Wt 117.0 lb

## 2020-12-30 DIAGNOSIS — M810 Age-related osteoporosis without current pathological fracture: Secondary | ICD-10-CM | POA: Diagnosis not present

## 2020-12-30 DIAGNOSIS — E039 Hypothyroidism, unspecified: Secondary | ICD-10-CM

## 2020-12-30 DIAGNOSIS — E782 Mixed hyperlipidemia: Secondary | ICD-10-CM

## 2020-12-30 DIAGNOSIS — Z8639 Personal history of other endocrine, nutritional and metabolic disease: Secondary | ICD-10-CM | POA: Diagnosis not present

## 2020-12-30 NOTE — Progress Notes (Signed)
Suzanne Patrick is a 75 y.o. female who presents to  [place] at [facility]  today, 12/30/20, seeking care for the following:  . Routine follow-up, here w/ husband, he reports she is doing well. She is pleasantly demented, oriented to person and place. Due for Prolia labs/injection.  . Otherwise, need to ensure refills for chronic condition Rx: dementia (stable), depression/anxiety (well controlled / stable), hypothyroid (stable, no new sx, due for labs)     ASSESSMENT & PLAN with other pertinent findings:  The primary encounter diagnosis was Age-related osteoporosis without current pathological fracture. Diagnoses of Mixed hyperlipidemia, History of hyperglycemia, and Hypothyroidism, unspecified type were also pertinent to this visit.    There are no Patient Instructions on file for this visit.  Orders Placed This Encounter  Procedures  . CBC  . COMPLETE METABOLIC PANEL WITH GFR  . Lipid panel  . TSH  . Hemoglobin A1c    No orders of the defined types were placed in this encounter.    See below for relevant physical exam findings  See below for recent lab and imaging results reviewed  Medications, allergies, PMH, PSH, SocH, FamH reviewed below    Follow-up instructions: Return in about 6 months (around 06/29/2021) for OUTINE CHECK-UP, SEE [place] SOONER IF NEEDED. CALL/MESSAGE W/ QUESTIONS.                                        Exam:  BP 102/67 (BP Location: Right Arm, Cuff Size: Normal)   Pulse 76   Temp 98.3 F (36.8 C) (Oral)   Resp 20   Ht 5\' 4"  (1.626 m)   Wt 117 lb (53.1 kg)   SpO2 100%   BMI 20.08 kg/m   Constitutional: VS see above. General Appearance: alert, well-developed, well-nourished, NAD  Neck: No masses, trachea midline.   Respiratory: Normal respiratory effort. no wheeze, no rhonchi, no rales  Cardiovascular: S1/S2 normal, no murmur, no rub/gallop auscultated.  RRR.   Musculoskeletal: Gait normal. Symmetric and independent movement of all extremities  Abdominal: non-tender, non-distended  Neurological: Normal balance/coordination. No tremor.  Skin: warm, dry, intact.   Psychiatric: Normal judgment/insight. Normal mood and affect. Oriented x3.   Current Meds  Medication Sig  . alendronate (FOSAMAX) 70 MG tablet TAKE 1 TABLET EVERY 7 DAYS  . atorvastatin (LIPITOR) 40 MG tablet TAKE 1 TABLET (40 MG TOTAL) BY MOUTH DAILY.  . citalopram (CELEXA) 20 MG tablet Take 1 tablet (20 mg total) by mouth daily.  Marland Kitchen donepezil (ARICEPT) 10 MG tablet TAKE 1 TABLET AT BEDTIME (NEED MD APPOINTMENT)  . levothyroxine (SYNTHROID) 50 MCG tablet Take 1 tablet (50 mcg total) by mouth daily before breakfast.  . Multiple Vitamins-Minerals (WOMENS MULTIVITAMIN PO) Take 1 tablet by mouth daily.    Allergies  Allergen Reactions  . Bee Venom Anaphylaxis    Patient Active Problem List   Diagnosis Date Noted  . Osteoporosis 06/01/2020  . Epidermal cyst of vulva 05/23/2018  . Osteopenia determined by x-ray 02/27/2018  . 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 Other fatigue 12/05/2017  . Urinary urgency 01/30/2017  . Upper airway cough syndrome 10/17/2016  . Chronic allergic rhinitis 10/17/2016  . Gastroesophageal reflux disease 10/17/2016  . Screening for colon cancer 10/09/2016  . Globus sensation 09/02/2016  . Benign skin growth 08/23/2016  . Adnexal mass 04/23/2016  . 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  Mild dementia ([place]) 12/06/2015  . Chronic tonsillitis 11/02/2015  . 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 Hypothyroidism 04/01/2015  . Hyperlipidemia 04/01/2015  . Depression 04/01/2015  . Anemia 04/01/2015  . Compression fracture of L1 lumbar vertebra ([place]) 04/01/2015    Family History  Problem Relation Age of Onset  . Heart attack Father   . Hyperlipidemia Mother     Social History   Tobacco Use  Smoking Status Never Smoker  Smokeless Tobacco Never Used     Past Surgical History:  Procedure Laterality Date  . BACK SURGERY    . BREAST BIOPSY Right   . BREAST EXCISIONAL BIOPSY Right   . TUBAL LIGATION      Immunization History  Administered Date(s) Administered  . Fluad Quad(high Dose 65+) 08/12/2019  . Influenza, High Dose Seasonal PF 07/17/2016, 07/23/2017, 09/10/2018  . Influenza,inj,Quad PF,6+ Mos 10/12/2015  . Moderna Sars-Covid-2 Vaccination 12/08/2019, 01/05/2020, 08/12/2020  . Pneumococcal Conjugate-13 10/12/2015  . Pneumococcal Polysaccharide-23 07/17/2016  . Tdap 07/17/2016    Recent Results (from the past 2160 hour(s))  CBC     Status: None   Collection Time: 12/30/20 12:00 AM  Result Value Ref Range   WBC 6.9 3.8 - 10.8 Thousand/uL   RBC 3.94 3.80 - 5.10 Million/uL   Hemoglobin 13.0 11.7 - 15.5 g/dL   HCT 37.6 35.0 - 45.0 %   MCV 95.4 80.0 - 100.0 fL   MCH 33.0 27.0 - 33.0 pg   MCHC 34.6 32.0 - 36.0 g/dL   RDW 12.0 11.0 - 15.0 %   Platelets 203 140 - 400 Thousand/uL   MPV 10.4 7.5 - 12.5 fL  COMPLETE METABOLIC PANEL WITH GFR     Status: Abnormal   Collection Time: 12/30/20 12:00 AM  Result Value Ref Range   Glucose, Bld 93 65 - 99 mg/dL    Comment: .            Fasting reference interval .    BUN 35 (H) 7 - 25 mg/dL   Creat 1.08 (H) 0.60 - 0.93 mg/dL    Comment: For patients >68 years of age, the reference limit for Creatinine is approximately 13% higher for people identified as African-American. .    GFR, Est Non African American 50 (L) > OR = 60 mL/min/1.18m2   GFR, Est African American 58 (L) > OR = 60 mL/min/1.31m2   BUN/Creatinine Ratio 32 (H) 6 - 22 (calc)   Sodium 142 135 - 146 mmol/L   Potassium 3.9 3.5 - 5.3 mmol/L   Chloride 111 (H) 98 - 110 mmol/L    Comment: Verified by repeat analysis. .    CO2 23 20 - 32 mmol/L   Calcium 9.7 8.6 - 10.4 mg/dL   Total Protein 6.7 6.1 - 8.1 g/dL   Albumin 4.5 3.6 - 5.1 g/dL   Globulin 2.2 1.9 - 3.7 g/dL (calc)   AG Ratio 2.0 1.0 - 2.5 (calc)    Total Bilirubin 0.6 0.2 - 1.2 mg/dL   Alkaline phosphatase (APISO) 61 37 - 153 U/L   AST 25 10 - 35 U/L   ALT 29 6 - 29 U/L  Lipid panel     Status: Abnormal   Collection Time: 12/30/20 12:00 AM  Result Value Ref Range   Cholesterol 125 <200 mg/dL   HDL 50 > OR = 50 mg/dL   Triglycerides 153 (H) <150 mg/dL   LDL Cholesterol (Calc) 51 mg/dL (calc)    Comment: Reference range: <100 . Desirable range <  100 mg/dL for primary prevention;   <70 mg/dL for patients with CHD or diabetic patients  with > or = 2 CHD risk factors. Marland Kitchen LDL-C is now calculated using the Martin-Hopkins  calculation, which is a validated novel method providing  better accuracy than the Friedewald equation in the  estimation of LDL-C.  Cresenciano Genre et al. Annamaria Helling. 3267;124(58): 2061-2068  (http://education.[facility].com/faq/FAQ164)    Total CHOL/HDL Ratio 2.5 <5.0 (calc)   Non-HDL Cholesterol (Calc) 75 <130 mg/dL (calc)    Comment: For patients with diabetes plus 1 major ASCVD risk  factor, treating to a non-HDL-C goal of <100 mg/dL  (LDL-C of <70 mg/dL) is considered a therapeutic  option.   TSH     Status: None   Collection Time: 12/30/20 12:00 AM  Result Value Ref Range   TSH 3.39 0.40 - 4.50 mIU/L  Hemoglobin A1c     Status: None   Collection Time: 12/30/20 12:00 AM  Result Value Ref Range   Hgb A1c MFr Bld 4.9 <5.7 % of total Hgb    Comment: For the purpose of screening for the presence of diabetes: . <5.7%       Consistent with the absence of diabetes 5.7-6.4%    Consistent with increased risk for diabetes             (prediabetes) > or =6.5%  Consistent with diabetes . This assay result is consistent with a decreased risk of diabetes. . Currently, no consensus exists regarding use of hemoglobin A1c for diagnosis of diabetes in children. . According to American Diabetes Association (ADA) guidelines, hemoglobin A1c <7.0% represents optimal control in non-pregnant diabetic patients.  Different metrics may apply to specific patient populations.  Standards of Medical Care in Diabetes(ADA). .    Mean Plasma Glucose 94 mg/dL   eAG (mmol/L) 5.2 mmol/L    No results found.     All questions at time of visit were answered - patient instructed to contact office with any additional concerns or updates. ER/RTC precautions were reviewed with the patient as applicable.   Please note: manual typing as well as voice recognition software may have been used to produce this document - typos may escape review. Please contact Dr. Sheppard Coil for any needed clarifications.

## 2020-12-31 LAB — CBC
HCT: 37.6 % (ref 35.0–45.0)
Hemoglobin: 13 g/dL (ref 11.7–15.5)
MCH: 33 pg (ref 27.0–33.0)
MCHC: 34.6 g/dL (ref 32.0–36.0)
MCV: 95.4 fL (ref 80.0–100.0)
MPV: 10.4 fL (ref 7.5–12.5)
Platelets: 203 10*3/uL (ref 140–400)
RBC: 3.94 10*6/uL (ref 3.80–5.10)
RDW: 12 % (ref 11.0–15.0)
WBC: 6.9 10*3/uL (ref 3.8–10.8)

## 2020-12-31 LAB — COMPLETE METABOLIC PANEL WITH GFR
AG Ratio: 2 (calc) (ref 1.0–2.5)
ALT: 29 U/L (ref 6–29)
AST: 25 U/L (ref 10–35)
Albumin: 4.5 g/dL (ref 3.6–5.1)
Alkaline phosphatase (APISO): 61 U/L (ref 37–153)
BUN/Creatinine Ratio: 32 (calc) — ABNORMAL HIGH (ref 6–22)
BUN: 35 mg/dL — ABNORMAL HIGH (ref 7–25)
CO2: 23 mmol/L (ref 20–32)
Calcium: 9.7 mg/dL (ref 8.6–10.4)
Chloride: 111 mmol/L — ABNORMAL HIGH (ref 98–110)
Creat: 1.08 mg/dL — ABNORMAL HIGH (ref 0.60–0.93)
GFR, Est African American: 58 mL/min/{1.73_m2} — ABNORMAL LOW (ref >=60)
GFR, Est Non African American: 50 mL/min/{1.73_m2} — ABNORMAL LOW (ref >=60)
Globulin: 2.2 g/dL (calc) (ref 1.9–3.7)
Glucose, Bld: 93 mg/dL (ref 65–99)
Potassium: 3.9 mmol/L (ref 3.5–5.3)
Sodium: 142 mmol/L (ref 135–146)
Total Bilirubin: 0.6 mg/dL (ref 0.2–1.2)
Total Protein: 6.7 g/dL (ref 6.1–8.1)

## 2020-12-31 LAB — LIPID PANEL
Cholesterol: 125 mg/dL (ref <200)
HDL: 50 mg/dL (ref >=50)
LDL Cholesterol (Calc): 51 mg/dL (calc)
Non-HDL Cholesterol (Calc): 75 mg/dL (calc) (ref <130)
Total CHOL/HDL Ratio: 2.5 (calc) (ref <5.0)
Triglycerides: 153 mg/dL — ABNORMAL HIGH (ref <150)

## 2020-12-31 LAB — HEMOGLOBIN A1C
Hgb A1c MFr Bld: 4.9 % of total Hgb (ref <5.7)
Mean Plasma Glucose: 94 mg/dL
eAG (mmol/L): 5.2 mmol/L

## 2020-12-31 LAB — TSH: TSH: 3.39 mIU/L (ref 0.40–4.50)

## 2021-01-20 ENCOUNTER — Telehealth: Payer: Self-pay

## 2021-01-20 NOTE — Telephone Encounter (Signed)
Documentation only.

## 2021-01-20 NOTE — Telephone Encounter (Signed)
Pt's husband stopped by the front desk requesting if pt can go back to taking an oral osteoporosis medication. Pt is currently doing the Prolia injections. Rx can be sent to pharmacy on file.

## 2021-01-23 NOTE — Telephone Encounter (Signed)
Sure. Can restart oral Rx 5-6 mos from last Prolia injection

## 2021-01-24 NOTE — Telephone Encounter (Signed)
Task completed. Left a detailed vm msg for pt's husband regarding change in med. Informed to check with pharmacy for the pt's rx. Direct call back info provided.

## 2021-01-25 ENCOUNTER — Other Ambulatory Visit: Payer: Self-pay

## 2021-01-25 MED ORDER — ALENDRONATE SODIUM 70 MG PO TABS: ORAL_TABLET | ORAL | 3 refills | Status: DC

## 2021-03-09 ENCOUNTER — Other Ambulatory Visit: Payer: Self-pay | Admitting: Osteopathic Medicine

## 2021-03-09 DIAGNOSIS — E782 Mixed hyperlipidemia: Secondary | ICD-10-CM

## 2021-03-29 ENCOUNTER — Other Ambulatory Visit: Payer: Self-pay | Admitting: Osteopathic Medicine

## 2021-03-29 DIAGNOSIS — F32A Depression, unspecified: Secondary | ICD-10-CM

## 2021-05-16 ENCOUNTER — Other Ambulatory Visit: Payer: Self-pay | Admitting: Osteopathic Medicine

## 2021-06-19 ENCOUNTER — Other Ambulatory Visit: Payer: Self-pay | Admitting: Osteopathic Medicine

## 2021-07-20 ENCOUNTER — Encounter: Payer: Self-pay | Admitting: Family Medicine

## 2021-07-20 ENCOUNTER — Other Ambulatory Visit: Payer: Self-pay

## 2021-07-20 ENCOUNTER — Ambulatory Visit (INDEPENDENT_AMBULATORY_CARE_PROVIDER_SITE_OTHER): Payer: Medicare HMO | Admitting: Family Medicine

## 2021-07-20 VITALS — BP 100/52 | HR 73 | Wt 111.0 lb

## 2021-07-20 DIAGNOSIS — Z741 Need for assistance with personal care: Secondary | ICD-10-CM

## 2021-07-20 DIAGNOSIS — Z23 Encounter for immunization: Secondary | ICD-10-CM

## 2021-07-20 DIAGNOSIS — F0280 Dementia in other diseases classified elsewhere without behavioral disturbance: Secondary | ICD-10-CM

## 2021-07-20 DIAGNOSIS — G301 Alzheimer's disease with late onset: Secondary | ICD-10-CM | POA: Diagnosis not present

## 2021-07-20 NOTE — Patient Instructions (Signed)
    [facility]  Address [address] [place], [place] [address]    [facility] [address], [place],  [address] Phone: [phone]  Visiting [facility] [place]  [phone]

## 2021-07-20 NOTE — Assessment & Plan Note (Signed)
Continue progressive symptoms related to her dementia.  She is unable to do many of her ADLs at this point.  We discussed community resources available for respite care.  We will place referral to home health for aid to see if insurance covers anything in regards to this.  We also discussed availability of assisted living in the area.

## 2021-07-20 NOTE — Progress Notes (Signed)
Suzanne Patrick - 75 y.o. female MRN [id number]  Date of birth: May 31, 1946  Subjective Chief Complaint  Patient presents with   Discuss Hospice/home health options    HPI Suzanne Patrick is a 75 year old female here today for follow-up of dementia.  She is accompanied by her husband, Waunita Schooner.  She has had continued progression of her dementia.  She has difficulty with short-term and long-term memory.  She is having increased difficulty following directions and completing ADLs.  She does need assistance with getting dressed, feeding herself, bathing and grooming.  She is taking Aricept daily however Waunita Schooner has not noticed any significant slowing of her cognitive decline with this.  He is looking into home health and/or respite care to help with caring for her at home.  He is there much of the time however he does continue to volunteer at the [place] a few times per week.  His daughter usually helps out during this time.  ROS:  A comprehensive ROS was completed and negative except as noted per HPI  Allergies  Allergen Reactions   Bee Venom Anaphylaxis    Past Medical History:  Diagnosis Date   Dementia ([place])     Past Surgical History:  Procedure Laterality Date   BACK SURGERY     BREAST BIOPSY Right    BREAST EXCISIONAL BIOPSY Right    TUBAL LIGATION      Social History   Socioeconomic History   Marital status: Married    Spouse name: Waunita Schooner   Number of children: 2   Years of education: 12   Highest education level: 12th grade  Occupational History   Occupation: Development worker, community business    Comment: retired  Tobacco Use   Smoking status: Never   Smokeless tobacco: Never  Vaping Use   Vaping Use: Never used  Substance and Sexual Activity   Alcohol use: Yes    Alcohol/week: 3.0 standard drinks    Types: 3 Standard drinks or equivalent per week   Drug use: No   Sexual activity: Yes    Partners: Male  Other Topics Concern   Not on file  Social History Narrative   Has dementia. Likes  to ride motorcycles back in her day   Social Determinants of Health   Financial Resource Strain: Not on file  Food Insecurity: Not on file  Transportation Needs: Not on file  Physical Activity: Not on file  Stress: Not on file  Social Connections: Not on file    Family History  Problem Relation Age of Onset   Heart attack Father    Hyperlipidemia Mother     Health Maintenance  Topic Date Due   Hepatitis C Screening  Never done   Zoster Vaccines- Shingrix (1 of 2) Never done   COVID-19 Vaccine (4 - Booster for Moderna series) 11/04/2020   Fecal DNA (Cologuard)  12/30/2021 (Originally 11/08/2019)   TETANUS/TDAP  07/17/2026   INFLUENZA VACCINE  Completed   DEXA SCAN  Completed   PNA vac Low Risk Adult  Completed   HPV VACCINES  Aged Out     ----------------------------------------------------------------------------------------------------------------------------------------------------------------------------------------------------------------- Physical Exam BP (!) 100/52   Pulse 73   Wt 111 lb (50.3 kg)   SpO2 100% Comment: on RA  BMI 19.05 kg/m   Physical Exam Constitutional:      Appearance: Normal appearance.  Neurological:     Mental Status: She is alert.     Comments: She is oriented to person and recognizes her husband.  She has to be  redirected in reminded to do tasks several times.  She is very pleasant.  Psychiatric:        Mood and Affect: Mood normal.        Behavior: Behavior normal.    ------------------------------------------------------------------------------------------------------------------------------------------------------------------------------------------------------------------- Assessment and Plan  Alzheimer's type dementia ([place]) Continue progressive symptoms related to her dementia.  She is unable to do many of her ADLs at this point.  We discussed community resources available for respite care.  We will place referral to home  health for aid to see if insurance covers anything in regards to this.  We also discussed availability of assisted living in the area.   No orders of the defined types were placed in this encounter.   No follow-ups on file.  35 minutes spent including pre visit preparation, review of prior notes and labs, encounter with patient via visit and same day documentation.   This visit occurred during the SARS-CoV-2 public health emergency.  Safety protocols were in place, including screening questions prior to the visit, additional usage of staff PPE, and extensive cleaning of exam room while observing appropriate contact time as indicated for disinfecting solutions.

## 2021-07-26 ENCOUNTER — Telehealth: Payer: Self-pay

## 2021-07-26 NOTE — Telephone Encounter (Signed)
Deanne Ward lvm after visiting with patient.   Advised spouse that Ms. Bartley is more appropriate for Adult Daycare or Personal Care services. Provided resources to him.   Per Dr. Zigmund Daniel, he also provided resource information to him concerning the same.

## 2021-10-15 ENCOUNTER — Other Ambulatory Visit: Payer: Self-pay | Admitting: Osteopathic Medicine

## 2021-10-15 DIAGNOSIS — E782 Mixed hyperlipidemia: Secondary | ICD-10-CM

## 2021-12-14 ENCOUNTER — Other Ambulatory Visit: Payer: Self-pay | Admitting: Osteopathic Medicine

## 2022-02-07 ENCOUNTER — Other Ambulatory Visit: Payer: Self-pay | Admitting: Osteopathic Medicine

## 2022-03-12 ENCOUNTER — Other Ambulatory Visit: Payer: Self-pay | Admitting: Medical-Surgical

## 2022-03-12 DIAGNOSIS — E782 Mixed hyperlipidemia: Secondary | ICD-10-CM

## 2022-04-01 ENCOUNTER — Other Ambulatory Visit: Payer: Self-pay | Admitting: Osteopathic Medicine

## 2022-04-01 DIAGNOSIS — F32A Depression, unspecified: Secondary | ICD-10-CM

## 2022-04-18 ENCOUNTER — Other Ambulatory Visit: Payer: Self-pay | Admitting: Medical-Surgical

## 2022-05-21 ENCOUNTER — Telehealth: Payer: Self-pay

## 2022-05-21 NOTE — Telephone Encounter (Signed)
Recommend office visit.  If this occurs again she needs to be seen in the ED.

## 2022-05-21 NOTE — Telephone Encounter (Signed)
Pt's husband lvm stating during the weekend his wife experienced seizure-like activity that resembled Parkinsons.  Requesting a phone call from Dr Zigmund Daniel to discuss treatment.

## 2022-05-22 NOTE — Telephone Encounter (Signed)
Spoke to pt. Scheduling appt.

## 2022-05-23 ENCOUNTER — Ambulatory Visit (INDEPENDENT_AMBULATORY_CARE_PROVIDER_SITE_OTHER): Payer: Medicare HMO | Admitting: Family Medicine

## 2022-05-23 ENCOUNTER — Encounter: Payer: Self-pay | Admitting: Family Medicine

## 2022-05-23 VITALS — BP 90/54 | HR 76 | Ht 64.0 in | Wt 99.0 lb

## 2022-05-23 DIAGNOSIS — R569 Unspecified convulsions: Secondary | ICD-10-CM | POA: Diagnosis not present

## 2022-05-23 DIAGNOSIS — D582 Other hemoglobinopathies: Secondary | ICD-10-CM | POA: Diagnosis not present

## 2022-05-23 DIAGNOSIS — R251 Tremor, unspecified: Secondary | ICD-10-CM

## 2022-05-23 DIAGNOSIS — E039 Hypothyroidism, unspecified: Secondary | ICD-10-CM | POA: Diagnosis not present

## 2022-05-23 DIAGNOSIS — G309 Alzheimer's disease, unspecified: Secondary | ICD-10-CM | POA: Diagnosis not present

## 2022-05-23 DIAGNOSIS — F0280 Dementia in other diseases classified elsewhere without behavioral disturbance: Secondary | ICD-10-CM | POA: Diagnosis not present

## 2022-05-23 NOTE — Assessment & Plan Note (Signed)
Unclear etiology.  No tremor or ataxia noted on exam today.  Possible orthostasis when standing too fast as her BP is a little low today.  She is overdue for updated TSH and will check bmp and CBC today.

## 2022-05-23 NOTE — Progress Notes (Signed)
NADALYN Patrick - 76 y.o. female MRN [id number]  Date of birth: 12/03/1945  Subjective Chief Complaint  Patient presents with   Tremors    HPI Suzanne Patrick is a 76 y.o. female with hx of alzheimers dementia, hypothyroid, and osteoporosis.   Her dementia is pretty advanced and she requires supervision and is unable to perform  many of her ADL's.  She does follow directions fairly well.  She is still able to dress herself with assistance and can feed herself.  Her husband is concerned that a few days ago she began shaking in all extremities after standing up to the point where she became weak and nearly fell.  This last for about 1-2 minutes and then resolved.  She did not lose consciousness.  No loss of bowel or bladder.  She has not been ill recently.  He reports that her PO intake is pretty good.  Doesn't like salty foods as much as she used to.    ROS:  A comprehensive ROS was completed and negative except as noted per HPI    Allergies  Allergen Reactions   Bee Venom Anaphylaxis    Past Medical History:  Diagnosis Date   Dementia ([place])     Past Surgical History:  Procedure Laterality Date   BACK SURGERY     BREAST BIOPSY Right    BREAST EXCISIONAL BIOPSY Right    TUBAL LIGATION      Social History   Socioeconomic History   Marital status: Married    Spouse name: Waunita Schooner   Number of children: 2   Years of education: 12   Highest education level: 12th grade  Occupational History   Occupation: Development worker, community business    Comment: retired  Tobacco Use   Smoking status: Never   Smokeless tobacco: Never  Vaping Use   Vaping Use: Never used  Substance and Sexual Activity   Alcohol use: Yes    Alcohol/week: 3.0 standard drinks of alcohol    Types: 3 Standard drinks or equivalent per week   Drug use: No   Sexual activity: Yes    Partners: Male  Other Topics Concern   Not on file  Social History Narrative   Has dementia. Likes to ride motorcycles back in  her day   Social Determinants of Health   Financial Resource Strain: Low Risk  (01/12/2019)   Overall Financial Resource Strain (CARDIA)    Difficulty of Paying Living Expenses: Not hard at all  Food Insecurity: No Food Insecurity (01/12/2019)   Hunger Vital Sign    Worried About Running Out of Food in the Last Year: Never true    Ran Out of Food in the Last Year: Never true  Transportation Needs: No Transportation Needs (01/12/2019)   PRAPARE - Hydrologist (Medical): No    Lack of Transportation (Non-Medical): No  Physical Activity: Inactive (01/12/2019)   Exercise Vital Sign    Days of Exercise per Week: 0 days    Minutes of Exercise per Session: 0 min  Stress: No Stress Concern Present (01/12/2019)   [place]    Feeling of Stress : Not at all  Social Connections: Moderately Integrated (01/12/2019)   Social Connection and Isolation Panel [NHANES]    Frequency of Communication with Friends and Family: More than three times a week    Frequency of Social Gatherings with Friends and Family: Three times a week    Attends  Religious Services: More than 4 times per year    Active Member of Clubs or Organizations: No    Attends Archivist Meetings: Never    Marital Status: Married    Family History  Problem Relation Age of Onset   Heart attack Father    Hyperlipidemia Mother     Health Maintenance  Topic Date Due   COVID-19 Vaccine (4 - Booster for Moderna series) 08/23/2022 (Originally 10/07/2020)   Zoster Vaccines- Shingrix (1 of 2) 08/23/2022 (Originally 09/05/1964)   Hepatitis C Screening  05/24/2023 (Originally 09/06/1963)   INFLUENZA VACCINE  06/12/2022   TETANUS/TDAP  07/17/2026   Pneumonia Vaccine 77+ Years old  Completed   DEXA SCAN  Completed   HPV VACCINES  Aged Out   Fecal DNA (Cologuard)  Discontinued      ----------------------------------------------------------------------------------------------------------------------------------------------------------------------------------------------------------------- Physical Exam BP (!) 90/54 (BP Location: Left Arm, Patient Position: Sitting, Cuff Size: Normal)   Pulse 76   Ht '5\' 4"'$  (1.626 m)   Wt 99 lb (44.9 kg)   SpO2 97%   BMI 16.99 kg/m   Physical Exam Constitutional:      Appearance: Normal appearance.  Eyes:     General: No scleral icterus. Cardiovascular:     Rate and Rhythm: Normal rate and regular rhythm.  Pulmonary:     Effort: Pulmonary effort is normal.     Breath sounds: Normal breath sounds.  Musculoskeletal:     Cervical back: Neck supple.  Neurological:     Mental Status: She is alert.     Comments: Not oriented to place/time  Responds to name and follows instructions.  No focal deficits.    Psychiatric:        Mood and Affect: Mood normal.        Behavior: Behavior normal.     ------------------------------------------------------------------------------------------------------------------------------------------------------------------------------------------------------------------- Assessment and Plan  Tremor Unclear etiology.  No tremor or ataxia noted on exam today.  Possible orthostasis when standing too fast as her BP is a little low today.  She is overdue for updated TSH and will check bmp and CBC today.     No orders of the defined types were placed in this encounter.   No follow-ups on file.    This visit occurred during the SARS-CoV-2 public health emergency.  Safety protocols were in place, including screening questions prior to the visit, additional usage of staff PPE, and extensive cleaning of exam room while observing appropriate contact time as indicated for disinfecting solutions.

## 2022-05-26 LAB — CBC WITH DIFFERENTIAL/PLATELET
Absolute Monocytes: 360 cells/uL (ref 200–950)
Basophils Absolute: 38 cells/uL (ref 0–200)
Basophils Relative: 0.8 %
Eosinophils Absolute: 110 cells/uL (ref 15–500)
Eosinophils Relative: 2.3 %
HCT: 24.8 % — ABNORMAL LOW (ref 35.0–45.0)
Hemoglobin: 7 g/dL — ABNORMAL LOW (ref 11.7–15.5)
Lymphs Abs: 965 cells/uL (ref 850–3900)
MCH: 21.4 pg — ABNORMAL LOW (ref 27.0–33.0)
MCHC: 28.2 g/dL — ABNORMAL LOW (ref 32.0–36.0)
MCV: 75.8 fL — ABNORMAL LOW (ref 80.0–100.0)
MPV: 11.5 fL (ref 7.5–12.5)
Monocytes Relative: 7.5 %
Neutro Abs: 3326 cells/uL (ref 1500–7800)
Neutrophils Relative %: 69.3 %
Platelets: 250 10*3/uL (ref 140–400)
RBC: 3.27 10*6/uL — ABNORMAL LOW (ref 3.80–5.10)
RDW: 14.5 % (ref 11.0–15.0)
Total Lymphocyte: 20.1 %
WBC: 4.8 10*3/uL (ref 3.8–10.8)

## 2022-05-26 LAB — IRON, TOTAL/TOTAL IRON BINDING CAP
%SAT: 5 % (calc) — ABNORMAL LOW (ref 16–45)
Iron: 17 ug/dL — ABNORMAL LOW (ref 45–160)
TIBC: 377 mcg/dL (calc) (ref 250–450)

## 2022-05-26 LAB — BASIC METABOLIC PANEL
BUN/Creatinine Ratio: 32 (calc) — ABNORMAL HIGH (ref 6–22)
BUN: 26 mg/dL — ABNORMAL HIGH (ref 7–25)
CO2: 23 mmol/L (ref 20–32)
Calcium: 8.7 mg/dL (ref 8.6–10.4)
Chloride: 107 mmol/L (ref 98–110)
Creat: 0.82 mg/dL (ref 0.60–1.00)
Glucose, Bld: 95 mg/dL (ref 65–99)
Potassium: 3.8 mmol/L (ref 3.5–5.3)
Sodium: 138 mmol/L (ref 135–146)

## 2022-05-26 LAB — TEST AUTHORIZATION

## 2022-05-26 LAB — CBC MORPHOLOGY

## 2022-05-26 LAB — TSH: TSH: 2.27 mIU/L (ref 0.40–4.50)

## 2022-06-12 ENCOUNTER — Encounter: Payer: Self-pay | Admitting: Family Medicine

## 2022-06-17 ENCOUNTER — Other Ambulatory Visit: Payer: Self-pay | Admitting: Physician Assistant

## 2022-06-18 ENCOUNTER — Other Ambulatory Visit: Payer: Self-pay

## 2022-06-18 ENCOUNTER — Encounter: Payer: Self-pay | Admitting: Family Medicine

## 2022-06-18 MED ORDER — ALENDRONATE SODIUM 70 MG PO TABS
ORAL_TABLET | ORAL | 0 refills | Status: DC
Start: 2022-06-18 — End: 2023-05-07

## 2022-06-20 ENCOUNTER — Encounter: Payer: Self-pay | Admitting: Family Medicine

## 2022-06-20 ENCOUNTER — Other Ambulatory Visit: Payer: Self-pay

## 2022-06-20 DIAGNOSIS — F32A Depression, unspecified: Secondary | ICD-10-CM

## 2022-06-20 MED ORDER — ATORVASTATIN CALCIUM 40 MG PO TABS: 40.0000 mg | ORAL_TABLET | Freq: Every day | ORAL | 3 refills | Status: DC

## 2022-06-20 MED ORDER — DONEPEZIL HCL 10 MG PO TABS: ORAL_TABLET | ORAL | 3 refills | Status: DC

## 2022-06-20 MED ORDER — CITALOPRAM HYDROBROMIDE 20 MG PO TABS: 20.0000 mg | ORAL_TABLET | Freq: Every day | ORAL | 3 refills | Status: DC

## 2022-06-22 NOTE — Telephone Encounter (Signed)
FL2 forms have been scanned into chart and patient's husband has been contacted that forms have been completed and are ready for pick up. Forms have been placed in accordion waiting for patient pick up. Patients husband was questioning if patients medication had been sent to pharmacy but did not know which medication, he stated he would send Dr Zigmund Daniel a mychart message. AMUCK

## 2022-06-22 NOTE — Telephone Encounter (Signed)
FL2 and last OV note printed.

## 2022-07-04 ENCOUNTER — Encounter: Payer: Self-pay | Admitting: General Practice

## 2022-07-23 ENCOUNTER — Other Ambulatory Visit: Payer: Self-pay | Admitting: Family Medicine

## 2022-07-23 DIAGNOSIS — R634 Abnormal weight loss: Secondary | ICD-10-CM

## 2022-07-23 DIAGNOSIS — G301 Alzheimer's disease with late onset: Secondary | ICD-10-CM

## 2022-07-23 DIAGNOSIS — F02C0 Dementia in other diseases classified elsewhere, severe, without behavioral disturbance, psychotic disturbance, mood disturbance, and anxiety: Secondary | ICD-10-CM

## 2022-08-26 ENCOUNTER — Other Ambulatory Visit: Payer: Self-pay | Admitting: Family Medicine

## 2022-08-26 DIAGNOSIS — F32A Depression, unspecified: Secondary | ICD-10-CM

## 2023-03-07 ENCOUNTER — Telehealth: Payer: Self-pay | Admitting: Family Medicine

## 2023-03-07 NOTE — Telephone Encounter (Signed)
Contacted Suzanne Patrick to schedule their annual wellness visit. Patient declined to schedule AWV at this time.  [name] Patient Engineer, production II Direct Dial: [phone]

## 2023-03-13 ENCOUNTER — Telehealth: Payer: Self-pay | Admitting: Family Medicine

## 2023-03-13 NOTE — Telephone Encounter (Signed)
Received a call from [facility] patient passed away on Mar 30, 2023 they need a death certificate signed  their phone number is [phone] Please advise

## 2023-03-13 NOTE — Telephone Encounter (Signed)
Death certificate completed through [place] [facility]

## 2023-03-13 DEATH — deceased
